# Patient Record
Sex: Male | Born: 1985 | Race: White | Hispanic: No | Marital: Married | Smoking: Former smoker
Health system: Southern US, Community
[De-identification: ages and names within clinical notes are randomized; demographics above are authoritative.]

## PROBLEM LIST (undated history)

## (undated) DIAGNOSIS — R1011 Right upper quadrant pain: Secondary | ICD-10-CM

## (undated) DIAGNOSIS — F1910 Other psychoactive substance abuse, uncomplicated: Secondary | ICD-10-CM

## (undated) DIAGNOSIS — K219 Gastro-esophageal reflux disease without esophagitis: Secondary | ICD-10-CM

## (undated) DIAGNOSIS — R519 Headache, unspecified: Secondary | ICD-10-CM

## (undated) DIAGNOSIS — E559 Vitamin D deficiency, unspecified: Secondary | ICD-10-CM

## (undated) HISTORY — DX: Right upper quadrant pain: R10.11

## (undated) HISTORY — DX: Headache, unspecified: R51.9

## (undated) HISTORY — PX: HERNIA REPAIR: SHX51

## (undated) HISTORY — DX: Vitamin D deficiency, unspecified: E55.9

## (undated) HISTORY — PX: WISDOM TOOTH EXTRACTION: SHX21

## (undated) HISTORY — DX: Gastro-esophageal reflux disease without esophagitis: K21.9

---
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
## 2003-02-04 ENCOUNTER — Emergency Department (HOSPITAL_COMMUNITY): Admission: AD | Admit: 2003-02-04 | Discharge: 2003-02-04 | Payer: Self-pay | Admitting: Family Medicine

## 2003-11-17 ENCOUNTER — Emergency Department (HOSPITAL_COMMUNITY): Admission: EM | Admit: 2003-11-17 | Discharge: 2003-11-17 | Payer: Self-pay | Admitting: Family Medicine

## 2005-04-13 ENCOUNTER — Emergency Department (HOSPITAL_COMMUNITY): Admission: EM | Admit: 2005-04-13 | Discharge: 2005-04-13 | Payer: Self-pay | Admitting: Emergency Medicine

## 2005-08-26 ENCOUNTER — Emergency Department (HOSPITAL_COMMUNITY): Admission: EM | Admit: 2005-08-26 | Discharge: 2005-08-26 | Payer: Self-pay | Admitting: Family Medicine

## 2007-08-05 ENCOUNTER — Emergency Department (HOSPITAL_COMMUNITY): Admission: EM | Admit: 2007-08-05 | Discharge: 2007-08-05 | Payer: Self-pay | Admitting: Family Medicine

## 2007-08-28 ENCOUNTER — Emergency Department (HOSPITAL_COMMUNITY): Admission: EM | Admit: 2007-08-28 | Discharge: 2007-08-28 | Payer: Self-pay | Admitting: Family Medicine

## 2009-02-25 ENCOUNTER — Emergency Department (HOSPITAL_COMMUNITY): Admission: EM | Admit: 2009-02-25 | Discharge: 2009-02-25 | Payer: Self-pay | Admitting: Family Medicine

## 2009-03-15 ENCOUNTER — Emergency Department (HOSPITAL_COMMUNITY): Admission: EM | Admit: 2009-03-15 | Discharge: 2009-03-15 | Payer: Self-pay | Admitting: Family Medicine

## 2009-11-11 ENCOUNTER — Emergency Department (HOSPITAL_COMMUNITY): Admission: EM | Admit: 2009-11-11 | Discharge: 2009-11-11 | Payer: Self-pay | Admitting: Family Medicine

## 2010-05-19 ENCOUNTER — Ambulatory Visit (INDEPENDENT_AMBULATORY_CARE_PROVIDER_SITE_OTHER): Payer: Self-pay

## 2010-05-19 ENCOUNTER — Inpatient Hospital Stay (INDEPENDENT_AMBULATORY_CARE_PROVIDER_SITE_OTHER)
Admission: RE | Admit: 2010-05-19 | Discharge: 2010-05-19 | Disposition: A | Discharge status: Home / Self Care | Payer: Self-pay | Source: Ambulatory Visit | Attending: Emergency Medicine | Admitting: Emergency Medicine

## 2010-05-19 DIAGNOSIS — K3189 Other diseases of stomach and duodenum: Secondary | ICD-10-CM

## 2010-05-19 DIAGNOSIS — K5900 Constipation, unspecified: Secondary | ICD-10-CM

## 2010-05-19 DIAGNOSIS — R1013 Epigastric pain: Secondary | ICD-10-CM

## 2010-05-19 LAB — POCT URINALYSIS DIPSTICK
Bilirubin Urine: NEGATIVE
Hgb urine dipstick: NEGATIVE
Ketones, ur: NEGATIVE mg/dL
Nitrite: NEGATIVE
Protein, ur: NEGATIVE mg/dL
Specific Gravity, Urine: 1.025 (ref 1.005–1.030)
Urine Glucose, Fasting: NEGATIVE mg/dL
Urobilinogen, UA: 0.2 mg/dL (ref 0.0–1.0)
pH: 7 (ref 5.0–8.0)

## 2010-07-07 LAB — POCT I-STAT, CHEM 8
Chloride: 101 mEq/L (ref 96–112)
Glucose, Bld: 108 mg/dL — ABNORMAL HIGH (ref 70–99)
HCT: 54 % — ABNORMAL HIGH (ref 39.0–52.0)
Potassium: 3.7 mEq/L (ref 3.5–5.1)

## 2010-07-07 LAB — POCT URINALYSIS DIP (DEVICE)
Glucose, UA: 100 mg/dL — AB
Hgb urine dipstick: NEGATIVE
Nitrite: NEGATIVE
Protein, ur: 30 mg/dL — AB
Specific Gravity, Urine: 1.025 (ref 1.005–1.030)
Urobilinogen, UA: 2 mg/dL — ABNORMAL HIGH (ref 0.0–1.0)
pH: 5.5 (ref 5.0–8.0)

## 2010-07-07 LAB — LIPASE, BLOOD: Lipase: 16 U/L (ref 11–59)

## 2010-08-11 ENCOUNTER — Inpatient Hospital Stay (INDEPENDENT_AMBULATORY_CARE_PROVIDER_SITE_OTHER)
Admission: RE | Admit: 2010-08-11 | Discharge: 2010-08-11 | Disposition: A | Discharge status: Home / Self Care | Payer: Self-pay | Source: Ambulatory Visit | Attending: Family Medicine | Admitting: Family Medicine

## 2010-08-11 ENCOUNTER — Ambulatory Visit (INDEPENDENT_AMBULATORY_CARE_PROVIDER_SITE_OTHER): Payer: Self-pay

## 2010-08-11 DIAGNOSIS — M545 Low back pain, unspecified: Secondary | ICD-10-CM

## 2010-08-20 NOTE — Consult Note (Signed)
[place]  Patient:    Hector Thomas, Hector Thomas                        MRN: [id number] Proc. Date: 07/04/99 Attending:  Angelia Mould. Derrell Lolling, M.D. CC:         Teena Irani. Donnie Coffin, M.D.                          Consultation Report  REASON FOR CONSULTATION:  Evaluate abdominal pain.  HISTORY OF PRESENT ILLNESS:  This is a 25 year old white male adolescent, previously in excellent health.  He has had some low-volume diarrhea for the past 48 hours.  This preceded the onset of abdominal pain.  At 9 p.m. last night, he  noted the gradual onset of lower abdominal pain which got worse.  He was nauseated earlier.  He now states the pain is better and the nausea has resolved, and he s now "starving."   He did have some chills earlier on.  He has not had any diarrhea for the past eight hours.  He denies pulmonary symptoms, specifically no cough r sputum production or chest pain.  He denies voiding difficulties, specifically denies urinary urgency or frequency.  He denies back pain.  He denies eating anything unusual.  He was evaluated in the emergency department, and blood work revealed hemoglobin 15.1, white count 14,400 with a left shift.  Creatinine 0.6.  Urinalysis was normal.  CT scan was nondiagnostic.  There was a little bit of free fluid in the pelvis.  The appendix was visualized and was slightly distended, being 9 mm in diameter, but there was certainly no sign of any soft tissue stranding or inflammatory change. The small intestine and large intestine were not inflamed or thick walled. This was reviewed with radiology, and they felt that these were nonspecific findings.  I was asked to see the patient by Dr. Ignacia Palma to rule out appendicitis.  PAST MEDICAL HISTORY:  Bilateral inguinal hernias at age 78 by Dr. Levie Heritage. Otherwise, no medical or surgical problems.  CURRENT MEDICATIONS:  None.  DRUG ALLERGIES:  None known.  FAMILY HISTORY:  Mother  living and well, had seizures in the past but none recently.  Father living and well.  He has one brother who was born with Hirschsprungs disease.  He has one sister living and well.  SOCIAL HISTORY:  The patient and his mother live in [place].  The mother is divorced.  The patient attends [employer] and is in the ninth grade.  He does smoke cigarettes but denies the use of alcohol or other drugs.  REVIEW OF SYSTEMS:  All systems are reviewed and are negative except as described above.  PHYSICAL EXAMINATION:  GENERAL:  A pleasant, healthy, adolescent male in no distress.  VITAL SIGNS:  Temperature 98.5, heart rate 88, respiratory rate 16, blood pressure 121/47.  HEENT:  Sclerae clear, nonicteric.  Extraocular movements intact.  Oropharynx clear without lesions.  NECK:  Supple, nontender.  No mass.   Carotid pulses 2+.  No adenopathy, on thyromegaly.  LUNGS:  Clear to auscultation.  No CVA tenderness.  HEART:  Regular rate and rhythm with no murmurs.  ABDOMEN:  Scaphoid and quite soft.  Active bowel sounds.  There was no palpable  mass.  There was no hernia.  There is mild diffuse lower abdominal tenderness which is mostly subjective in character.  He has no  guarding or rebound tenderness whatsoever.  GENITALIA:  Penis, scrotum, and testes were normal.  No inflammation or tenderness.  EXTREMITIES:  No edema.  Good pulses.  NEUROLOGIC:  Exam grossly within normal limits.  IMPRESSION:  Lower abdominal pain.  Physical findings and clinical course strongly suggest self-limited disease such as viral gastroenteritis.  I cannot completely exclude appendicitis, but this seems less likely.  Doubt Crohns disease.  PLAN:   I advised the patient and his mother that a period of observation would be the  most appropriate course.  Specifically, the patient is to stay on clear liquids for 12 to 24 hours, and the patient and his mother are to call me  or come to the emergency room if there is any fever greater than 101, worsening of pain, or vomiting.  I offered them observation in the hospital or at home, and they wanted to go home.  They were given my card, and the instructions were written out for  them.  They will call me if this does not completely resolve in the next 24 hours. DD:  07/04/99 TD:  07/04/99 Job: [id number]

## 2010-11-08 ENCOUNTER — Inpatient Hospital Stay (INDEPENDENT_AMBULATORY_CARE_PROVIDER_SITE_OTHER)
Admission: RE | Admit: 2010-11-08 | Discharge: 2010-11-08 | Disposition: A | Discharge status: Home / Self Care | Payer: Managed Care, Other (non HMO) | Source: Ambulatory Visit | Attending: Emergency Medicine | Admitting: Emergency Medicine

## 2010-11-08 ENCOUNTER — Ambulatory Visit (INDEPENDENT_AMBULATORY_CARE_PROVIDER_SITE_OTHER): Payer: Managed Care, Other (non HMO)

## 2010-11-08 DIAGNOSIS — R319 Hematuria, unspecified: Secondary | ICD-10-CM

## 2010-11-08 DIAGNOSIS — K5900 Constipation, unspecified: Secondary | ICD-10-CM

## 2010-11-08 LAB — OCCULT BLOOD, POC DEVICE: Fecal Occult Bld: NEGATIVE

## 2010-11-08 LAB — POCT URINALYSIS DIP (DEVICE)
Bilirubin Urine: NEGATIVE
Glucose, UA: NEGATIVE mg/dL
Ketones, ur: NEGATIVE mg/dL
Specific Gravity, Urine: 1.02 (ref 1.005–1.030)

## 2010-12-29 LAB — POCT RAPID STREP A: Streptococcus, Group A Screen (Direct): NEGATIVE

## 2011-02-01 ENCOUNTER — Ambulatory Visit (INDEPENDENT_AMBULATORY_CARE_PROVIDER_SITE_OTHER): Payer: Managed Care, Other (non HMO)

## 2011-02-01 ENCOUNTER — Inpatient Hospital Stay (INDEPENDENT_AMBULATORY_CARE_PROVIDER_SITE_OTHER)
Admission: RE | Admit: 2011-02-01 | Discharge: 2011-02-01 | Disposition: A | Discharge status: Home / Self Care | Payer: Managed Care, Other (non HMO) | Source: Ambulatory Visit | Attending: Family Medicine | Admitting: Family Medicine

## 2011-02-01 DIAGNOSIS — S61209A Unspecified open wound of unspecified finger without damage to nail, initial encounter: Secondary | ICD-10-CM

## 2011-02-01 DIAGNOSIS — R509 Fever, unspecified: Secondary | ICD-10-CM

## 2011-06-13 ENCOUNTER — Emergency Department (INDEPENDENT_AMBULATORY_CARE_PROVIDER_SITE_OTHER)
Admission: EM | Admit: 2011-06-13 | Discharge: 2011-06-13 | Disposition: A | Discharge status: Home / Self Care | Payer: Managed Care, Other (non HMO) | Source: Home / Self Care

## 2011-06-13 ENCOUNTER — Encounter (HOSPITAL_COMMUNITY): Payer: Self-pay | Admitting: *Deleted

## 2011-06-13 DIAGNOSIS — R112 Nausea with vomiting, unspecified: Secondary | ICD-10-CM

## 2011-06-13 MED ORDER — ONDANSETRON 4 MG PO TBDP
ORAL_TABLET | ORAL | Status: AC
  Filled 2011-06-13: qty 1

## 2011-06-13 MED ORDER — ONDANSETRON 4 MG PO TBDP
4.0000 mg | ORAL_TABLET | Freq: Once | ORAL | Status: AC
  Administered 2011-06-13: 4 mg via ORAL

## 2011-06-13 NOTE — ED Provider Notes (Signed)
Medical screening examination/treatment/procedure(s) were performed by non-physician practitioner and as supervising physician I was immediately available for consultation/collaboration.  Alen Bleacher, MD 06/13/11 [phone]

## 2011-06-13 NOTE — ED Provider Notes (Signed)
History     CSN: [id number]  Arrival date & time 06/13/11  1100   None     Chief Complaint  Patient presents with  . Nausea    (Consider location/radiation/quality/duration/timing/severity/associated sxs/prior treatment) HPI Comments: Patient presents today with complaints of nausea, vomiting, and abdominal cramping. He states symptoms began about 4 hours after eating some chicken last night. He states that he vomited 5 or 6 times him during the night. Last emesis was at 9 AM this morning. He continues to have nausea. No diarrhea. He is urinating normally without dysuria. No fever or chills. He denies any known sick contacts. He states other family members who ate the same dinner last night did not get ill.   History reviewed. No pertinent past medical history.  History reviewed. No pertinent past surgical history.  History reviewed. No pertinent family history.  History  Substance Use Topics  . Smoking status: Not on file  . Smokeless tobacco: Not on file  . Alcohol Use: Not on file      Review of Systems  Constitutional: Negative for fever and chills.  Gastrointestinal: Positive for nausea and vomiting. Negative for diarrhea and constipation.  Genitourinary: Negative for decreased urine volume.    Allergies  Review of patient's allergies indicates no known allergies.  Home Medications  No current outpatient prescriptions on file.  BP 138/84  Pulse 76  Temp(Src) 97.9 F (36.6 C) (Oral)  Resp 14  SpO2 100%  Physical Exam  Nursing note and vitals reviewed. Constitutional: He appears well-developed and well-nourished. No distress.  HENT:  Head: Normocephalic and atraumatic.  Right Ear: Tympanic membrane, external ear and ear canal normal.  Left Ear: Tympanic membrane, external ear and ear canal normal.  Nose: Nose normal.  Mouth/Throat: Uvula is midline, oropharynx is clear and moist and mucous membranes are normal. No oropharyngeal exudate, posterior  oropharyngeal edema or posterior oropharyngeal erythema.  Neck: Neck supple.  Cardiovascular: Normal rate, regular rhythm and normal heart sounds.   Pulmonary/Chest: Effort normal and breath sounds normal. No respiratory distress.  Abdominal: Soft. Bowel sounds are normal. He exhibits no distension and no mass. There is no tenderness.  Lymphadenopathy:    He has no cervical adenopathy.  Neurological: He is alert.  Skin: Skin is warm and dry.  Psychiatric: He has a normal mood and affect.    ED Course  Procedures (including critical care time)  Labs Reviewed - No data to display No results found.   1. Nausea & vomiting       MDM  Onset of N/V and stomach cramping after eating chicken dinner last night. Vomited 5-6 times over 10 hrs then resolved. Symptoms improving but still has some nausea and requesting off work note for today.         Melody Comas, [place] 06/13/11 1400

## 2011-06-13 NOTE — Discharge Instructions (Signed)
Clear fluids and a light bland diet today. Return if symptoms change or worsen.  Nausea and Vomiting Nausea means you feel sick to your stomach. Throwing up (vomiting) is a reflex where stomach contents come out of your mouth. HOME CARE   Take medicine as told by your doctor.   Do not force yourself to eat. However, you do need to drink fluids.   If you feel like eating, eat a normal diet as told by your doctor.   Eat rice, wheat, potatoes, bread, lean meats, yogurt, fruits, and vegetables.   Avoid high-fat foods.   Drink enough fluids to keep your pee (urine) clear or pale yellow.   Ask your doctor how to replace body fluid losses (rehydrate). Signs of body fluid loss (dehydration) include:   Feeling very thirsty.   Dry lips and mouth.   Feeling dizzy.   Dark pee.   Peeing less than normal.   Feeling confused.   Fast breathing or heart rate.  GET HELP RIGHT AWAY IF:   You have blood in your throw up.   You have black or bloody poop (stool).   You have a bad headache or stiff neck.   You feel confused.   You have bad belly (abdominal) pain.   You have chest pain or trouble breathing.   You do not pee at least once every 8 hours.   You have cold, clammy skin.   You keep throwing up after 24 to 48 hours.   You have a fever.  MAKE SURE YOU:   Understand these instructions.   Will watch your condition.   Will get help right away if you are not doing well or get worse.  Document Released: 09/07/2007 Document Revised: 03/10/2011 Document Reviewed: 08/20/2010 [facility] Patient Information 2012 [place], [place].

## 2011-06-13 NOTE — ED Notes (Signed)
Pt  Reports   Symptoms  Of  Nausea  Vomiting  Low  abd  Cramps        Witch    Started  About  1100 pm last  Night     No  Active  Vomiting  At  This  Time     he  Reported  Some  Low  abd  Cramps       To boot

## 2015-05-11 ENCOUNTER — Emergency Department (HOSPITAL_COMMUNITY)
Admission: EM | Admit: 2015-05-11 | Discharge: 2015-05-12 | Disposition: A | Discharge status: Home / Self Care | Payer: BLUE CROSS/BLUE SHIELD | Attending: Emergency Medicine | Admitting: Emergency Medicine

## 2015-05-11 ENCOUNTER — Emergency Department (HOSPITAL_COMMUNITY): Admission: EM | Admit: 2015-05-11 | Discharge: 2015-05-11 | Discharge status: Absent without leave | Payer: Self-pay

## 2015-05-11 ENCOUNTER — Emergency Department (HOSPITAL_COMMUNITY): Payer: BLUE CROSS/BLUE SHIELD

## 2015-05-11 ENCOUNTER — Encounter (HOSPITAL_COMMUNITY): Payer: Self-pay

## 2015-05-11 DIAGNOSIS — R0602 Shortness of breath: Secondary | ICD-10-CM | POA: Insufficient documentation

## 2015-05-11 DIAGNOSIS — R202 Paresthesia of skin: Secondary | ICD-10-CM | POA: Insufficient documentation

## 2015-05-11 DIAGNOSIS — Z79899 Other long term (current) drug therapy: Secondary | ICD-10-CM | POA: Insufficient documentation

## 2015-05-11 DIAGNOSIS — F17200 Nicotine dependence, unspecified, uncomplicated: Secondary | ICD-10-CM | POA: Diagnosis not present

## 2015-05-11 DIAGNOSIS — Z792 Long term (current) use of antibiotics: Secondary | ICD-10-CM | POA: Diagnosis not present

## 2015-05-11 DIAGNOSIS — R079 Chest pain, unspecified: Secondary | ICD-10-CM | POA: Diagnosis not present

## 2015-05-11 DIAGNOSIS — R61 Generalized hyperhidrosis: Secondary | ICD-10-CM | POA: Diagnosis not present

## 2015-05-11 DIAGNOSIS — R05 Cough: Secondary | ICD-10-CM | POA: Insufficient documentation

## 2015-05-11 LAB — I-STAT CHEM 8, ED
BUN: 22 mg/dL — AB (ref 6–20)
CALCIUM ION: 1.16 mmol/L (ref 1.12–1.23)
CREATININE: 1 mg/dL (ref 0.61–1.24)
Chloride: 103 mmol/L (ref 101–111)
Glucose, Bld: 103 mg/dL — ABNORMAL HIGH (ref 65–99)
HCT: 52 % (ref 39.0–52.0)
Hemoglobin: 17.7 g/dL — ABNORMAL HIGH (ref 13.0–17.0)
Potassium: 3.6 mmol/L (ref 3.5–5.1)
SODIUM: 138 mmol/L (ref 135–145)
TCO2: 24 mmol/L (ref 0–100)

## 2015-05-11 LAB — CBC
HCT: 46.5 % (ref 39.0–52.0)
HEMOGLOBIN: 16.3 g/dL (ref 13.0–17.0)
MCH: 30.8 pg (ref 26.0–34.0)
MCHC: 35.1 g/dL (ref 30.0–36.0)
MCV: 87.7 fL (ref 78.0–100.0)
Platelets: 212 10*3/uL (ref 150–400)
RBC: 5.3 MIL/uL (ref 4.22–5.81)
RDW: 12.1 % (ref 11.5–15.5)
WBC: 7.4 10*3/uL (ref 4.0–10.5)

## 2015-05-11 LAB — BRAIN NATRIURETIC PEPTIDE: B Natriuretic Peptide: 3.5 pg/mL (ref 0.0–100.0)

## 2015-05-11 LAB — I-STAT TROPONIN, ED: Troponin i, poc: 0 ng/mL (ref 0.00–0.08)

## 2015-05-11 NOTE — ED Notes (Signed)
Pt complains of left chest pain and arm tingling tonight during a basketball game, he describes it as pressure and hurting when coughing

## 2015-05-12 LAB — URINE MICROSCOPIC-ADD ON
Bacteria, UA: NONE SEEN
Squamous Epithelial / LPF: NONE SEEN
WBC UA: NONE SEEN WBC/hpf (ref 0–5)

## 2015-05-12 LAB — URINALYSIS, ROUTINE W REFLEX MICROSCOPIC
Bilirubin Urine: NEGATIVE
Glucose, UA: NEGATIVE mg/dL
KETONES UR: NEGATIVE mg/dL
LEUKOCYTES UA: NEGATIVE
Nitrite: NEGATIVE
PROTEIN: NEGATIVE mg/dL
Specific Gravity, Urine: 1.037 — ABNORMAL HIGH (ref 1.005–1.030)
pH: 6 (ref 5.0–8.0)

## 2015-05-12 LAB — I-STAT TROPONIN, ED: TROPONIN I, POC: 0 ng/mL (ref 0.00–0.08)

## 2015-05-12 NOTE — Discharge Instructions (Signed)
Nonspecific Chest Pain  °Chest pain can be caused by many different conditions. There is always a chance that your pain could be related to something serious, such as a heart attack or a blood clot in your lungs. Chest pain can also be caused by conditions that are not life-threatening. If you have chest pain, it is very important to follow up with your health care provider. °CAUSES  °Chest pain can be caused by: °· Heartburn. °· Pneumonia or bronchitis. °· Anxiety or stress. °· Inflammation around your heart (pericarditis) or lung (pleuritis or pleurisy). °· A blood clot in your lung. °· A collapsed lung (pneumothorax). It can develop suddenly on its own (spontaneous pneumothorax) or from trauma to the chest. °· Shingles infection (varicella-zoster virus). °· Heart attack. °· Damage to the bones, muscles, and cartilage that make up your chest wall. This can include: °¨ Bruised bones due to injury. °¨ Strained muscles or cartilage due to frequent or repeated coughing or overwork. °¨ Fracture to one or more ribs. °¨ Sore cartilage due to inflammation (costochondritis). °RISK FACTORS  °Risk factors for chest pain may include: °· Activities that increase your risk for trauma or injury to your chest. °· Respiratory infections or conditions that cause frequent coughing. °· Medical conditions or overeating that can cause heartburn. °· Heart disease or family history of heart disease. °· Conditions or health behaviors that increase your risk of developing a blood clot. °· Having had chicken pox (varicella zoster). °SIGNS AND SYMPTOMS °Chest pain can feel like: °· Burning or tingling on the surface of your chest or deep in your chest. °· Crushing, pressure, aching, or squeezing pain. °· Dull or sharp pain that is worse when you move, cough, or take a deep breath. °· Pain that is also felt in your back, neck, shoulder, or arm, or pain that spreads to any of these areas. °Your chest pain may come and go, or it may stay  constant. °DIAGNOSIS °Lab tests or other studies may be needed to find the cause of your pain. Your health care provider may have you take a test called an ambulatory ECG (electrocardiogram). An ECG records your heartbeat patterns at the time the test is performed. You may also have other tests, such as: °· Transthoracic echocardiogram (TTE). During echocardiography, sound waves are used to create a picture of all of the heart structures and to look at how blood flows through your heart. °· Transesophageal echocardiogram (TEE). This is a more advanced imaging test that obtains images from inside your body. It allows your health care provider to see your heart in finer detail. °· Cardiac monitoring. This allows your health care provider to monitor your heart rate and rhythm in real time. °· Holter monitor. This is a portable device that records your heartbeat and can help to diagnose abnormal heartbeats. It allows your health care provider to track your heart activity for several days, if needed. °· Stress tests. These can be done through exercise or by taking medicine that makes your heart beat more quickly. °· Blood tests. °· Imaging tests. °TREATMENT  °Your treatment depends on what is causing your chest pain. Treatment may include: °· Medicines. These may include: °¨ Acid blockers for heartburn. °¨ Anti-inflammatory medicine. °¨ Pain medicine for inflammatory conditions. °¨ Antibiotic medicine, if an infection is present. °¨ Medicines to dissolve blood clots. °¨ Medicines to treat coronary artery disease. °· Supportive care for conditions that do not require medicines. This may include: °¨ Resting. °¨ Applying heat   or cold packs to injured areas. °¨ Limiting activities until pain decreases. °HOME CARE INSTRUCTIONS °· If you were prescribed an antibiotic medicine, finish it all even if you start to feel better. °· Avoid any activities that bring on chest pain. °· Do not use any tobacco products, including  cigarettes, chewing tobacco, or electronic cigarettes. If you need help quitting, ask your health care provider. °· Do not drink alcohol. °· Take medicines only as directed by your health care provider. °· Keep all follow-up visits as directed by your health care provider. This is important. This includes any further testing if your chest pain does not go away. °· If heartburn is the cause for your chest pain, you may be told to keep your head raised (elevated) while sleeping. This reduces the chance that acid will go from your stomach into your esophagus. °· Make lifestyle changes as directed by your health care provider. These may include: °¨ Getting regular exercise. Ask your health care provider to suggest some activities that are safe for you. °¨ Eating a heart-healthy diet. A registered dietitian can help you to learn healthy eating options. °¨ Maintaining a healthy weight. °¨ Managing diabetes, if necessary. °¨ Reducing stress. °SEEK MEDICAL CARE IF: °· Your chest pain does not go away after treatment. °· You have a rash with blisters on your chest. °· You have a fever. °SEEK IMMEDIATE MEDICAL CARE IF:  °· Your chest pain is worse. °· You have an increasing cough, or you cough up blood. °· You have severe abdominal pain. °· You have severe weakness. °· You faint. °· You have chills. °· You have sudden, unexplained chest discomfort. °· You have sudden, unexplained discomfort in your arms, back, neck, or jaw. °· You have shortness of breath at any time. °· You suddenly start to sweat, or your skin gets clammy. °· You feel nauseous or you vomit. °· You suddenly feel light-headed or dizzy. °· Your heart begins to beat quickly, or it feels like it is skipping beats. °These symptoms may represent a serious problem that is an emergency. Do not wait to see if the symptoms will go away. Get medical help right away. Call your local emergency services (911 in the [place] Do not drive yourself to the hospital. °  °This  information is not intended to replace advice given to you by your health care provider. Make sure you discuss any questions you have with your health care provider. °  °Document Released: 12/29/2004 Document Revised: 04/11/2014 Document Reviewed: 10/25/2013 °Elsevier Interactive Patient Education ©2016 Elsevier Inc. ° ° °Emergency Department Resource Guide °1) Find a Doctor and Pay Out of Pocket °Although you won't have to find out who is covered by your insurance plan, it is a good idea to ask around and get recommendations. You will then need to call the office and see if the doctor you have chosen will accept you as a new patient and what types of options they offer for patients who are self-pay. Some doctors offer discounts or will set up payment plans for their patients who do not have insurance, but you will need to ask so you aren't surprised when you get to your appointment. ° °2) Contact Your Local Health Department °Not all health departments have doctors that can see patients for sick visits, but many do, so it is worth a call to see if yours does. If you don't know where your local health department is, you can check in your phone book.   The CDC also has a tool to help you locate your state's health department, and many state websites also have listings of all of their local health departments. ° °3) Find a Walk-in Clinic °If your illness is not likely to be very severe or complicated, you may want to try a walk in clinic. These are popping up all over the country in pharmacies, drugstores, and shopping centers. They're usually staffed by nurse practitioners or physician assistants that have been trained to treat common illnesses and complaints. They're usually fairly quick and inexpensive. However, if you have serious medical issues or chronic medical problems, these are probably not your best option. ° °No Primary Care Doctor: °- Call [facility] at  832-8000 - they can help you locate a primary  care doctor that  accepts your insurance, provides certain services, etc. °- Physician Referral Service- 1-800-533-3463 ° °Chronic Pain Problems: °Organization         Address  Phone   Notes  °[facility]  (336) 297-2271 Patients need to be referred by their primary care doctor.  ° °Medication Assistance: °Organization         Address  Phone   Notes  °[place] [facility] [address] °[place], [place] [address] (336) 641-8030 --Must be a resident of [place] °-- Must have NO insurance coverage whatsoever (no Medicaid/ Medicare, etc.) °-- The pt. MUST have a primary care doctor that directs their care regularly and follows them in the community °  °[facility]  (866) 331-1348   °[employer]  (888) 892-1162   ° °Agencies that provide inexpensive medical care: °Organization         Address  Phone   Notes  °[facility]  (336) 832-8035   °[facility]    (336) 832-7272   °[facility] [address] °[place], [place] [address] (336) 832-4777   °[facility] [address], °[place] (336) 271-4999   °[facility]    (336) 373-0678   °[facility]    (336) 272-1050   °[facility] ° [address], [place] Phone:  (336) 832-4444, Fax:  (336) 832-4440 Hours of Operation:  9 am - 6 pm, M-F.  Also accepts Medicaid/Medicare and self-pay.  °[facility] ° [address], [place] Phone: (336) 832-3150, Fax: (336) [phone]. Hours of Operation:  8:30 am - 5:30 pm, M-F.  Also accepts Medicaid and self-pay.  °[facility] [place] [address], [place] Phone: (336) 878-6027   °[facility] [address], [place], [place] (336)723-1848, Ext. 123 Mondays & Thursdays: 7-9 AM.  First 15 patients are seen on a first come, first serve basis. °  ° °Medicaid-accepting [place]  Providers: ° °Organization         Address  Phone   Notes  °[facility] [address], [place] (336) 641-2100 Also accepts self-pay patients.  °[facility] [address], [place] ° (336) 856-9996   °[facility] [address], [place] (336) 288-8857   °[facility] [address] [place] [address], [place] (336) 299-7000   °Veita Bland [address], [place]  ° (336) 373-1557 Only accepts [place] Access Medicaid patients after they have their name applied to their card.  ° °Self-Pay (  no insurance) in [place]: ° °Organization         Address  Phone   Notes  °Sickle Cell Patients, [facility] [address], [place] (336) 832-1970   °[facility] [address], [place] (336) 832-4400   °[facility] [place] ° [address], [place] (336) 992-4800   °[facility]/Dr. Osei-Bonsu ° [address] [place] [address], [place] or [address], [place] (336) [phone] Phone number for both [place] and [place] locations is the same.  °[facility] [address], [place] (336) 299-0000   °[facility] [address] [place] [address], [place] or [address] (336) 852-7530 °(336) 878-2260   °[facility] [address], [place] (336) 350-1642, phone; (336) 294-5005, fax Sees patients 1st and 3rd Saturday of every month.  Must not qualify for public or private insurance (i.e. Medicaid, Medicare, [place] Health Choice, Veterans' Benefits) • Household income should be no more than 200% of the poverty level •The clinic cannot treat you if you are pregnant or think you are pregnant • Sexually transmitted diseases are not treated at the clinic.  ° ° °Dental Care: °Organization         Address  Phone  Notes  °[facility] [address], [place] (336) 641-6152 Accepts children up to age 21 who are enrolled in Medicaid or [place] Health Choice; pregnant women with a Medicaid card; and children who have applied for Medicaid or [place] Health Choice, but were declined, whose parents can pay a reduced fee at time of service.  °[facility] [place]  [address], [place] (336) 641-7733 Accepts children up to age 21 who are enrolled in Medicaid or [place] Health Choice; pregnant women with a Medicaid card; and children who have applied for Medicaid or [place] Health Choice, but were declined, whose parents can pay a reduced fee at time of service.  °[facility] ° [address], [place] (336) 641-4533 Patients are seen by appointment only. Walk-ins are not accepted. [facility] will see patients 18 years of age and older. °Monday - Tuesday (8am-5pm) °Most Wednesdays (8:30-5pm) °$30 per visit, cash only  °[facility] ° [address], [place] (336) 641-4533 Patients are seen by appointment only. Walk-ins are not accepted. [facility] will see patients 18 years of age and older. °One Wednesday Evening (Monthly: Volunteer Based).  $30 per visit, cash only  °[facility]  (919) 537-3737 for adults; Children under age 4, call Graduate Pediatric Dentistry at (919) [phone]. Children aged 4-14, please call (919) [phone] to request a pediatric application. ° Dental services are provided in all areas of dental care including fillings, crowns and bridges, complete and partial dentures, implants, gum treatment, root canals, and extractions. Preventive care is also provided. Treatment is provided to both adults and children. °Patients are selected via a lottery and there is often a waiting list. °  °[facility] [address], ° ° (336) 763-8833 [email or web address] °  °[facility]  [address], [place], [place] (336)723-1848, Ext. 123 Second and Fourth Thursday of each month, opens at 6:30 AM; Clinic ends at 9 AM.  Patients are seen on a first-come first-served basis, and a limited number are   seen during each clinic.  ° °[facility] ° [address], [place], [place] (336) 723-7904   Eligibility Requirements °You must have lived in [place], [place], or [place] counties for at least the last three months. °  You cannot be eligible for state or federal sponsored healthcare insurance, including Veterans Administration, Medicaid, or Medicare. °  You generally cannot be eligible for healthcare insurance through your employer.  °  How to apply: °Eligibility screenings are held every Tuesday and Wednesday afternoon from 1:00 pm until 4:00 pm. You do not need an appointment for the interview!  °[facility] [address], [place], [place] 336-631-2330   °[facility]  336-342-8273   °[facility]  336-703-3100   °[facility]  336-570-6415   ° °Behavioral Health Resources in the Community: °Intensive Outpatient Programs °Organization         Address  Phone  Notes  °[facility] [address], [place], [place] 336-878-6098   °[facility] [address], [place], [place] 336-832-9800   °[facility] [address], [place], [place] ° 336-882-2125   °[facility] [address],  °[place], [place] 1-800-853-5163 or 336-641-4981   °Substance Abuse Resources °Organization         Address  Phone  Notes  °[facility]  336-882-2125   °[facility]  336-784-9470   °[facility]  336-285-9073   °[facility]  336-845-3988   °Residential & Outpatient Substance Abuse Program  1-800-659-3381   °Psychological Services °Organization         Address  Phone  Notes  °[facility]  336- 832-9600    °[facility]  336- 378-7881   °[facility] [address], [place] 1-800-853-5163 or 336-641-4981   ° °Mobile Crisis Teams °Organization         Address  Phone  Notes  °[facility], Mobile Crisis Care Unit  1-877-626-1772   °Assertive °[facility] ° [address] [place], [place] 336-834-9664   °Sharon DeEsch [address] °[place] 336-554-5454   ° °Self-Help/Support Groups °Organization         Address  Phone             Notes  °[facility] - variety of support groups  336- 373-1402 Call for more information  °Narcotics Anonymous (NA), [facility] [address], °[place]  2 meetings at this location  ° °Residential Treatment Programs °Organization         Address  Phone  Notes  °[facility] [address],    °[place]  1-866-801-8205   °[facility] ° [address], [place], [place] 704-293-8524   °[facility] [address], [place] 336-845-3988 Admissions: 8am-3pm M-F  °[facility] [address]    °[place], [place] 336-841-1104   °[facility] [address], [place], [place] 336-379-7146   °[facility] [address]  °[place], [place] 336-285-9073   °[facility] Intensive Outpatient [address], [place], [place] 336-852-3033   °[facility] [address]  °[place], [place] 1-877-615-2722 or 336-784-9470   °[facility] [address] [place], [place] 336-227-7417 Accepts Medicaid  °[facility] [address]  °  [place] 1-800-659-3381 Substance Abuse/Addiction Treatment  ° °[place] Behavioral Health Resources °Organization         Address  Phone  Notes  °[facility]  (888) 581-9988   °Julie Brannon, PhD [address] [place], [place]   (336) 349-5553 or (336) 951-0000    °[facility]   [address] °[place], [place] (336) 349-4454   °[facility] [address], [place], [place] (336) 342-8316 Insurance/Medicaid/sponsorship through [facility]  °[facility] [address]                                    [place], [place] (336) 342-8316 Therapy/tele-psych/case  °[facility] [address]  ° [place], [place] (336) 349-2233    °Dr. Arfeen  (336) 349-4544   °[facility] [place]  [employer] [place] [facility] 1) [address], [place] °2) [address], [place] °3)  [address], [place] (336) 349-3220 °(336) 342-7768 ° °(336) 342-8140   °[place] [facility] (336) 342-1394 or (336) 342-3537 (After Hours)    ° ° ° °

## 2015-05-12 NOTE — ED Provider Notes (Signed)
By signing my name below, I, [name], attest that this documentation has been prepared under the direction and in the presence of [name], DO . Electronically Signed: [name], Scribe. 05/12/2015. 1:43 AM.  TIME SEEN: 1:29 AM  CHIEF COMPLAINT: Chest Pain  HPI: HPI Comments:  Hector Thomas is a 30 y.o. male with history of tobacco use who presents to the Emergency Department complaining of gradual onset left-side chest pain described as pressure beginning yesterday morning, and worsening yesterday evening around 7 hrs ago. Pt reports associated left arm tingling, diaphoresis, dry cough, and SOB. He notes current relief of symptoms. He is now completely asymptomatic. He reports FHx of CAD in mother diagnosed ~34 yo. Pt is a current smoker. Pt denies having a stress test in the past. He does not have a PCP currently. Pt denies nausea, dizziness, blood clots in legs or lungs, recent travel, surgery, or broken bones. Symptoms started at rest.  ROS: See HPI Constitutional: no fever  Eyes: no drainage  ENT: no runny nose   Cardiovascular:  chest pain  Resp: SOB  GI: no vomiting GU: no dysuria Integumentary: no rash  Allergy: no hives  Musculoskeletal: no leg swelling  Neurological: no slurred speech ROS otherwise negative  PAST MEDICAL HISTORY/PAST SURGICAL HISTORY:  History reviewed. No pertinent past medical history.  MEDICATIONS:  Prior to Admission medications   Medication Sig Start Date End Date Taking? Authorizing Provider  PROAIR HFA 108 [phone] Base) MCG/ACT inhaler Inhale 1-2 puffs into the lungs every 4 (four) hours as needed. Shortness of breath/ wheezing 04/17/15  Yes Historical Provider, MD  levofloxacin (LEVAQUIN) 500 MG tablet Take 500 mg by mouth daily. Reported on 05/11/2015 04/17/15   Historical Provider, MD    ALLERGIES:  No Known Allergies  SOCIAL HISTORY:  Social History  Substance Use Topics  . Smoking status: Current Every Day Smoker  .  Smokeless tobacco: Not on file  . Alcohol Use: Yes    FAMILY HISTORY: History reviewed. No pertinent family history.  EXAM: BP 122/96 mmHg  Pulse 79  Temp(Src) 98.5 F (36.9 C) (Oral)  Resp 16  Ht  (1.676 m)  Wt 206 lb (93.441 kg)  BMI 33.27 kg/m2  SpO2 99% CONSTITUTIONAL: Alert and oriented and responds appropriately to questions. Well-appearing; well-nourished HEAD: Normocephalic EYES: Conjunctivae clear, PERRL ENT: normal nose; no rhinorrhea; moist mucous membranes; pharynx without lesions noted NECK: Supple, no meningismus, no LAD  CARD: RRR; S1 and S2 appreciated; no murmurs, no clicks, no rubs, no gallops RESP: Normal chest excursion without splinting or tachypnea; breath sounds clear and equal bilaterally; no wheezes, no rhonchi, no rales, no hypoxia or respiratory distress, speaking full sentences ABD/GI: Normal bowel sounds; non-distended; soft, non-tender, no rebound, no guarding, no peritoneal signs BACK:  The back appears normal and is non-tender to palpation, there is no CVA tenderness EXT: Normal ROM in all joints; non-tender to palpation; no edema; normal capillary refill; no cyanosis, no calf tenderness or swelling    SKIN: Normal color for age and race; warm NEURO: Moves all extremities equally, sensation to light touch intact diffusely, cranial nerves II through XII intact PSYCH: The patient's mood and manner are appropriate. Grooming and personal hygiene are appropriate.  MEDICAL DECISION MAKING: Patient here with episode of chest pain. His have a history of tobacco use and family history of coronary artery disease. He has had 2 negative troponins. EKG shows no ischemic abnormality. He is now completely asymptomatic. Chest x-ray  is clear. I have low suspicion that this is ACS and I feel he is safe to be discharged. He has no risk factors for pulmonary embolus and is PERC negative. Have recommended close outpatient follow-up with her primary care provider. Have  discussed strict return precautions. He verbalizes understanding and is comfortable with this plan.      EKG Interpretation  Date/Time:  Monday May 11 2015 21:14:38 EST Ventricular Rate:  70 PR Interval:  116 QRS Duration: 94 QT Interval:  353 QTC Calculation: 381 R Axis:   66 Text Interpretation:  Sinus rhythm Borderline short PR interval No significant change since last tracing in 2007 Reconfirmed by [name],  DO, [name] [phone]) on 05/12/2015 1:06:39 AM         I personally performed the services described in this documentation, which was scribed in my presence. The recorded information has been reviewed and is accurate.    [name], DO 05/12/15 0222

## 2015-11-29 ENCOUNTER — Encounter: Payer: Self-pay | Admitting: Emergency Medicine

## 2015-11-29 ENCOUNTER — Emergency Department (INDEPENDENT_AMBULATORY_CARE_PROVIDER_SITE_OTHER)
Admission: EM | Admit: 2015-11-29 | Discharge: 2015-11-29 | Disposition: A | Discharge status: Home / Self Care | Payer: BLUE CROSS/BLUE SHIELD | Source: Home / Self Care | Attending: Family Medicine | Admitting: Family Medicine

## 2015-11-29 DIAGNOSIS — M5442 Lumbago with sciatica, left side: Secondary | ICD-10-CM | POA: Diagnosis not present

## 2015-11-29 DIAGNOSIS — S39012A Strain of muscle, fascia and tendon of lower back, initial encounter: Secondary | ICD-10-CM | POA: Diagnosis not present

## 2015-11-29 MED ORDER — KETOROLAC TROMETHAMINE 30 MG/ML IJ SOLN
30.0000 mg | Freq: Once | INTRAMUSCULAR | Status: AC
  Administered 2015-11-29: 30 mg via INTRAMUSCULAR

## 2015-11-29 MED ORDER — METHYLPREDNISOLONE ACETATE 80 MG/ML IJ SUSP
80.0000 mg | Freq: Once | INTRAMUSCULAR | Status: AC
  Administered 2015-11-29: 80 mg via INTRAMUSCULAR

## 2015-11-29 MED ORDER — PREDNISONE 20 MG PO TABS: ORAL_TABLET | ORAL | 0 refills | Status: DC

## 2015-11-29 MED ORDER — MELOXICAM 7.5 MG PO TABS: 7.5000 mg | ORAL_TABLET | Freq: Every day | ORAL | 0 refills | Status: DC

## 2015-11-29 MED ORDER — METHOCARBAMOL 500 MG PO TABS: 500.0000 mg | ORAL_TABLET | Freq: Two times a day (BID) | ORAL | 0 refills | Status: DC

## 2015-11-29 MED ORDER — HYDROCODONE-ACETAMINOPHEN 5-325 MG PO TABS: 1.0000 | ORAL_TABLET | Freq: Four times a day (QID) | ORAL | 0 refills | Status: DC | PRN

## 2015-11-29 NOTE — ED Triage Notes (Signed)
[facility]/O pain in the lower back that has been intermittent for several months. Today after mowing patients pain was worse, radiating into the left buttocks and down the left leg into the left foot. Constant pain with intermittent spasms. Patient rates pain 8/10

## 2015-11-29 NOTE — ED Provider Notes (Signed)
CSN: [id number]     Arrival date & time 11/29/15  1453 History   First MD Initiated Contact with Patient 11/29/15 1550     Chief Complaint  Patient presents with  . Back Pain   (Consider location/radiation/quality/duration/timing/severity/associated sxs/prior Treatment) HPI Hector Thomas is a 30 y.o. male presenting to UC with c/o gradually worsening lower back pain over the last several months.  Earlier today after pt finished mowing a lawn on a ride-on mower back pain became even more severe causing pt to have difficulty moving.  Pain is aching and sharp at times with associated spasms.  Pain is 8/10 at this time, worse with certain movements. He reports taking ibuprofen w/o relief around 9AM. His father did give him one of his Vicodin just PTA w/o much relief. Pt notes he also tried one of his wife's muscle relaxers last week but did not have much relief.  Pain occasionally radiates down Left leg. He was considering f/u with an orthopedist but was unsure if he needed a referral. Denies change in bowel or bladder habits.   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 No hx of back surgeries.    History reviewed. No pertinent past medical history. History reviewed. No pertinent surgical history. History reviewed. No pertinent family history. Social History  Substance Use Topics  . Smoking status: Current Every Day Smoker  . Smokeless tobacco: Never Used  . Alcohol use Yes    Review of Systems  Constitutional: Negative for chills and fever.  Gastrointestinal: Negative for abdominal pain, nausea and vomiting.  Genitourinary: Negative for dysuria, flank pain, frequency and hematuria.  Musculoskeletal: Positive for back pain, gait problem and myalgias. Negative for arthralgias, joint swelling, neck pain and neck stiffness.  Skin: Negative for color change and rash.  Neurological: Negative for weakness and  numbness.    Allergies  Review of patient's allergies indicates no known allergies.  Home Medications   Prior to Admission medications   Medication Sig Start Date End Date Taking? Authorizing Provider  acidophilus (RISAQUAD) CAPS capsule Take 1 capsule by mouth daily.   Yes Historical Provider, MD  ibuprofen (ADVIL,MOTRIN) 800 MG tablet Take 800 mg by mouth every 8 (eight) hours as needed.   Yes Historical Provider, MD  HYDROcodone-acetaminophen (NORCO/VICODIN) 5-325 MG tablet Take 1-2 tablets by mouth every 6 (six) hours as needed for moderate pain or severe pain. 11/29/15   Junius FinnerErin O'Malley, PA-C  levofloxacin (LEVAQUIN) 500 MG tablet Take 500 mg by mouth daily. Reported on 05/11/2015 04/17/15   Historical Provider, MD  meloxicam (MOBIC) 7.5 MG tablet Take 1-2 tablets (7.5-15 mg total) by mouth daily. Take 2 tabs daily for 5 days, then 1-2 tabs daily as needed for pain 11/29/15   Junius FinnerErin O'Malley, PA-C  methocarbamol (ROBAXIN) 500 MG tablet Take 1 tablet (500 mg total) by mouth 2 (two) times daily. 11/29/15   Junius FinnerErin O'Malley, PA-C  predniSONE (DELTASONE) 20 MG tablet 3 tabs po day one, then 2 po daily x 4 days 11/29/15   Junius FinnerErin O'Malley, PA-C  PROAIR HFA 108 [id number](90 Base) MCG/ACT inhaler Inhale 1-2 puffs into the lungs every 4 (four) hours as needed. Shortness of breath/ wheezing 04/17/15   Historical Provider, MD   Meds Ordered and Administered this Visit   Medications  ketorolac (TORADOL) 30 MG/ML injection 30 mg (30 mg Intramuscular Given 11/29/15 1528)  methylPREDNISolone acetate (DEPO-MEDROL) injection  80 mg (80 mg Intramuscular Given 11/29/15 1600)    BP 145/96 (BP Location: Left Arm)   Pulse 78   Temp 97.9 F (36.6 C) (Oral)   Resp 16   Ht 5\' 6"  (1.676 m)   Wt 202 lb (91.6 kg)   SpO2 98%   BMI 32.60 kg/m  No data found.   Physical Exam  Constitutional: He is oriented to person, place, and time. He appears well-developed and well-nourished.  HENT:  Head: Normocephalic and atraumatic.   Eyes: EOM are normal.  Neck: Normal range of motion.  Cardiovascular: Normal rate.   Pulmonary/Chest: Effort normal.  Musculoskeletal: Normal range of motion. He exhibits tenderness. He exhibits no edema.  No midline spinal tenderness. Tenderness to Left lower lumbar muscles, Left buttock and Left lateral thigh.  Negative straight leg raise. Full ROM upper and lower extremities with 5/5 strength bilaterally.   Neurological: He is alert and oriented to person, place, and time.  Skin: Skin is warm and dry. No erythema.  Psychiatric: He has a normal mood and affect. His behavior is normal.  Nursing note and vitals reviewed.   Urgent Care Course   Clinical Course    Procedures (including critical care time)  Labs Review Labs Reviewed - No data to display  Imaging Review No results found.    MDM   1. Low back strain, initial encounter   2. Left-sided low back pain with left-sided sciatica    Pt c/o worsening lower back pain, worse on Left side radiating down Left leg. No red flag symptoms. No known injury. No indication for imaging at this time. Will treat conservatively for muscle strain and sciatica.  Toradol 30mg  IM and Depomedrol 80mg  IM given in UC Pain improved from 8/10 to 3/10  Home care instructions provided. Rx: Norco (only take as needed for severe pain, do not share with others), prednisone, meloxicam and robaxin See AVS for medication warnings.  Encouraged to call to schedule f/u appointment with Dr. Denyse Amass, Sports Medicine, for further evaluation and treatment of lower back pain. Patient verbalized understanding and agreement with treatment plan.     Junius Finner, PA-C 11/29/15 [phone]

## 2015-11-29 NOTE — Discharge Instructions (Signed)
°  You were given a shot of depomedrol (a steroid) today to help with inflammation and pain in your back.  You have been prescribed 5 days of prednisone, an oral steroid.  You may start this medication tomorrow with breakfast.    Norco/Vicodin (hydrocodone-acetaminophen) is a narcotic pain medication, do not combine these medications with others containing tylenol. While taking, do not drink alcohol, drive, or perform any other activities that requires focus while taking these medications. ONLY take as needed for severe pain. Do not share with others as this medication can cause dangerous side effects including difficulty breathing and death if combined with other medications you may not be aware someone else is taking.  Meloxicam (Mobic) is an antiinflammatory to help with pain and inflammation.  Do not take ibuprofen, Advil, Aleve, or any other medications that contain NSAIDs while taking meloxicam as this may cause stomach upset or even ulcers if taken in large amounts for an extended period of time.   Robaxin is a muscle relaxer and may cause drowsiness. Do not drink alcohol, drive, or operate heavy machinery while taking.

## 2015-12-01 ENCOUNTER — Encounter: Payer: Self-pay | Admitting: Gastroenterology

## 2016-02-04 ENCOUNTER — Emergency Department (HOSPITAL_COMMUNITY)
Admission: EM | Admit: 2016-02-04 | Discharge: 2016-02-04 | Disposition: A | Discharge status: Home / Self Care | Payer: BLUE CROSS/BLUE SHIELD | Attending: Emergency Medicine | Admitting: Emergency Medicine

## 2016-02-04 ENCOUNTER — Encounter (HOSPITAL_COMMUNITY): Payer: Self-pay | Admitting: Emergency Medicine

## 2016-02-04 DIAGNOSIS — Z79899 Other long term (current) drug therapy: Secondary | ICD-10-CM | POA: Diagnosis not present

## 2016-02-04 DIAGNOSIS — R42 Dizziness and giddiness: Secondary | ICD-10-CM | POA: Diagnosis not present

## 2016-02-04 DIAGNOSIS — R1010 Upper abdominal pain, unspecified: Secondary | ICD-10-CM

## 2016-02-04 DIAGNOSIS — F17200 Nicotine dependence, unspecified, uncomplicated: Secondary | ICD-10-CM | POA: Diagnosis not present

## 2016-02-04 DIAGNOSIS — R1011 Right upper quadrant pain: Secondary | ICD-10-CM | POA: Diagnosis present

## 2016-02-04 LAB — HEPATIC FUNCTION PANEL
ALBUMIN: 4.3 g/dL (ref 3.5–5.0)
ALT: 45 U/L (ref 17–63)
AST: 47 U/L — AB (ref 15–41)
Alkaline Phosphatase: 81 U/L (ref 38–126)
BILIRUBIN TOTAL: 1.2 mg/dL (ref 0.3–1.2)
Bilirubin, Direct: 0.4 mg/dL (ref 0.1–0.5)
Indirect Bilirubin: 0.8 mg/dL (ref 0.3–0.9)
TOTAL PROTEIN: 7.2 g/dL (ref 6.5–8.1)

## 2016-02-04 LAB — URINALYSIS, ROUTINE W REFLEX MICROSCOPIC
Bilirubin Urine: NEGATIVE
GLUCOSE, UA: NEGATIVE mg/dL
Hgb urine dipstick: NEGATIVE
KETONES UR: NEGATIVE mg/dL
LEUKOCYTES UA: NEGATIVE
NITRITE: NEGATIVE
PROTEIN: NEGATIVE mg/dL
Specific Gravity, Urine: 1.014 (ref 1.005–1.030)
pH: 6 (ref 5.0–8.0)

## 2016-02-04 LAB — CBC
HEMATOCRIT: 48.8 % (ref 39.0–52.0)
Hemoglobin: 16.9 g/dL (ref 13.0–17.0)
MCH: 30.8 pg (ref 26.0–34.0)
MCHC: 34.6 g/dL (ref 30.0–36.0)
MCV: 89.1 fL (ref 78.0–100.0)
PLATELETS: 204 10*3/uL (ref 150–400)
RBC: 5.48 MIL/uL (ref 4.22–5.81)
RDW: 12.4 % (ref 11.5–15.5)
WBC: 5.8 10*3/uL (ref 4.0–10.5)

## 2016-02-04 LAB — BASIC METABOLIC PANEL
Anion gap: 7 (ref 5–15)
BUN: 13 mg/dL (ref 6–20)
CHLORIDE: 107 mmol/L (ref 101–111)
CO2: 23 mmol/L (ref 22–32)
Calcium: 9.2 mg/dL (ref 8.9–10.3)
Creatinine, Ser: 0.87 mg/dL (ref 0.61–1.24)
Glucose, Bld: 101 mg/dL — ABNORMAL HIGH (ref 65–99)
POTASSIUM: 4.9 mmol/L (ref 3.5–5.1)
SODIUM: 137 mmol/L (ref 135–145)

## 2016-02-04 LAB — CBG MONITORING, ED: Glucose-Capillary: 102 mg/dL — ABNORMAL HIGH (ref 65–99)

## 2016-02-04 LAB — LIPASE, BLOOD: LIPASE: 22 U/L (ref 11–51)

## 2016-02-04 MED ORDER — OMEPRAZOLE 20 MG PO CPDR: 20.0000 mg | DELAYED_RELEASE_CAPSULE | Freq: Every day | ORAL | 0 refills | Status: DC

## 2016-02-04 NOTE — ED Provider Notes (Signed)
[facility]-EMERGENCY DEPT Provider Note   CSN: [id number] Arrival date & time: 02/04/16  0737     History   Chief Complaint Chief Complaint  Patient presents with  . multiple complaints  . Dizziness  . rib cage pain  . Bad taste in Mouth    HPI Hector Thomas is a [age] y.o. male.  HPI Patient presents with right-sided upper abdominal or lower rib pain. Began a   a few weeks ago. States it is worse when he is hungry. States he may make it better. States he also gets a bad taste in the back of his throat. States that tends to get worse when the pain gets worse. States over the last 2 days she has felt more dizzy and has had some black stool. States he felt lightheaded. States he has had bad gastric reflux but has been taking probiotics and sent that it helps. States that he has not been taking anything else for the abdominal pain but has been on a lot of Motrin for some of his other pains. No history of ulcers. No weight loss. No chest pain. No trouble breathing.  History reviewed. No pertinent past medical history.  There are no active problems to display for this patient.   History reviewed. No pertinent surgical history.     Home Medications    Prior to Admission medications   Medication Sig Start Date End Date Taking? Authorizing Provider  acidophilus (RISAQUAD) CAPS capsule Take 1 capsule by mouth every morning.    Yes Historical Provider, MD  Multiple Vitamin (MULTIVITAMIN WITH MINERALS) TABS tablet Take 1 tablet by mouth daily.   Yes Historical Provider, MD  PROAIR HFA 108 (90 Base) MCG/ACT inhaler Inhale 1-2 puffs into the lungs every 4 (four) hours as needed. Shortness of breath/ wheezing 04/17/15  Yes Historical Provider, MD  cephALEXin (KEFLEX) 250 MG capsule Take 250 mg by mouth 4 (four) times daily. 10 days 02/03/16   Historical Provider, MD  HYDROcodone-acetaminophen (NORCO/VICODIN) 5-325 MG tablet Take 1-2 tablets by mouth every 6 (six) hours as needed for moderate pain  or severe pain. Patient not taking: Reported on 02/04/2016 11/29/15   Junius FinnerErin O'Malley, PA-C  levofloxacin (LEVAQUIN) 500 MG tablet Take 500 mg by mouth daily. Reported on 05/11/2015 04/17/15   Historical Provider, MD  meloxicam (MOBIC) 7.5 MG tablet Take 1-2 tablets (7.5-15 mg total) by mouth daily. Take 2 tabs daily for 5 days, then 1-2 tabs daily as needed for pain Patient not taking: Reported on 02/04/2016 11/29/15   Junius FinnerErin O'Malley, PA-C  methocarbamol (ROBAXIN) 500 MG tablet Take 1 tablet (500 mg total) by mouth 2 (two) times daily. Patient not taking: Reported on 02/04/2016 11/29/15   Junius FinnerErin O'Malley, PA-C  omeprazole (PRILOSEC) 20 MG capsule Take 1 capsule (20 mg total) by mouth daily. 02/04/16   Benjiman CoreNathan Javarus Dorner, MD  predniSONE (DELTASONE) 20 MG tablet 3 tabs po day one, then 2 po daily x 4 days Patient not taking: Reported on 02/04/2016 11/29/15   Junius FinnerErin O'Malley, PA-C    Family History No family history on file.  Social History Social History  Substance Use Topics  . Smoking status: Current Every Day Smoker  . Smokeless tobacco: Never Used  . Alcohol use Yes     Allergies   Review of patient's allergies indicates no known allergies.   Review of Systems Review of Systems  Constitutional: Negative for appetite change and unexpected weight change.  HENT: Negative for dental problem, sinus pressure and sore  throat.   Respiratory: Negative for shortness of breath.   Cardiovascular: Negative for chest pain.  Gastrointestinal: Positive for abdominal pain. Negative for nausea and vomiting.  Genitourinary: Negative for flank pain.  Musculoskeletal: Negative for gait problem.  Neurological: Negative for light-headedness.  Psychiatric/Behavioral: Negative for behavioral problems and decreased concentration.     Physical Exam Updated Vital Signs BP 132/85 (BP Location: Left Arm)   Pulse 85   Temp 98.2 F (36.8 C) (Oral)   Resp 16   SpO2 98%   Physical Exam  Constitutional: He appears  well-developed.  HENT:  Head: Atraumatic.  Eyes: EOM are normal.  Neck: Neck supple.  Cardiovascular: Normal rate.   Pulmonary/Chest: Effort normal. He exhibits no tenderness.  Abdominal: Soft. There is no tenderness.  Musculoskeletal: He exhibits no edema.  Neurological: He is alert.  Skin: Skin is warm. Capillary refill takes less than 2 seconds.     ED Treatments / Results  Labs (all labs ordered are listed, but only abnormal results are displayed) Labs Reviewed  BASIC METABOLIC PANEL - Abnormal; Notable for the following:       Result Value   Glucose, Bld 101 (*)    All other components within normal limits  HEPATIC FUNCTION PANEL - Abnormal; Notable for the following:    AST 47 (*)    All other components within normal limits  CBG MONITORING, ED - Abnormal; Notable for the following:    Glucose-Capillary 102 (*)    All other components within normal limits  CBC  URINALYSIS, ROUTINE W REFLEX MICROSCOPIC (NOT AT [facility])  LIPASE, BLOOD  POC OCCULT BLOOD, ED    EKG  EKG Interpretation  Date/Time:  Thursday February 04 2016 07:46:55 EDT Ventricular Rate:  70 PR Interval:    QRS Duration: 89 QT Interval:  362 QTC Calculation: 391 R Axis:   75 Text Interpretation:  Sinus rhythm Borderline short PR interval Baseline wander in lead(s) V2 Confirmed by Rubin PayorPICKERING  MD, Harrold DonathNATHAN [phone]) on 02/04/2016 8:43:57 AM Also confirmed by Rubin PayorPICKERING  MD, Kemba Hoppes [phone]), editor Whitney PostLOGAN, Cala BradfordKIMBERLY [phone]([id number])  on 02/04/2016 9:54:43 AM       Radiology No results found.  Procedures Procedures (including critical care time)  Medications Ordered in ED Medications - No data to display   Initial Impression / Assessment and Plan / ED Course  I have reviewed the triage vital signs and the nursing notes.  Pertinent labs & imaging results that were available during my care of the patient were reviewed by me and considered in my medical decision making (see chart for details).  Clinical Course     Patient with pain in his upper abdomen when he does not eat. Labs overall reassuring. States he feels lightheaded but not orthostatic. States he had blood in the stool but after discussion patient refused a rectal exam. Hemoglobin is reassuring. Has GI follow-up. Lungs are clear. Will discharge home.  Final Clinical Impressions(s) / ED Diagnoses   Final diagnoses:  Pain of upper abdomen    New Prescriptions New Prescriptions   OMEPRAZOLE (PRILOSEC) 20 MG CAPSULE    Take 1 capsule (20 mg total) by mouth daily.     Benjiman CoreNathan El Pile, MD 02/04/16 [phone]

## 2016-02-04 NOTE — ED Triage Notes (Signed)
Pt presents to ED with multiple complaints. Pt primarily c/o R rib cage pain that worsens when his stomach is empty. Pt denies tenderness to palpation or tenderness with deep breathing. Pt sts this pain has been off and on for "weeks now." Pt also c/o dizziness x 2 days, sts he felt like he might pass out multiple times this morning. Pt also c/o bad taste in his mouth and no matter how much he scrapes his tongue it still remains. Pt sts he relates this to a possible ulcer. A&Ox4 and ambulatory.

## 2016-02-04 NOTE — Discharge Instructions (Signed)
-   Follow up with GI as planned

## 2016-02-19 ENCOUNTER — Encounter: Payer: Self-pay | Admitting: Gastroenterology

## 2016-02-19 ENCOUNTER — Ambulatory Visit (INDEPENDENT_AMBULATORY_CARE_PROVIDER_SITE_OTHER): Payer: BLUE CROSS/BLUE SHIELD | Admitting: Gastroenterology

## 2016-02-19 ENCOUNTER — Other Ambulatory Visit: Payer: Self-pay

## 2016-02-19 ENCOUNTER — Other Ambulatory Visit (INDEPENDENT_AMBULATORY_CARE_PROVIDER_SITE_OTHER): Payer: BLUE CROSS/BLUE SHIELD

## 2016-02-19 VITALS — BP 112/78 | HR 91 | Ht 66.0 in | Wt 201.0 lb

## 2016-02-19 DIAGNOSIS — R1011 Right upper quadrant pain: Secondary | ICD-10-CM

## 2016-02-19 DIAGNOSIS — R194 Change in bowel habit: Secondary | ICD-10-CM | POA: Diagnosis not present

## 2016-02-19 DIAGNOSIS — K219 Gastro-esophageal reflux disease without esophagitis: Secondary | ICD-10-CM

## 2016-02-19 HISTORY — DX: Gastro-esophageal reflux disease without esophagitis: K21.9

## 2016-02-19 LAB — HEPATIC FUNCTION PANEL
ALBUMIN: 4.8 g/dL (ref 3.5–5.2)
ALT: 31 U/L (ref 0–53)
AST: 27 U/L (ref 0–37)
Alkaline Phosphatase: 87 U/L (ref 39–117)
Bilirubin, Direct: 0.1 mg/dL (ref 0.0–0.3)
Total Bilirubin: 0.5 mg/dL (ref 0.2–1.2)
Total Protein: 7.6 g/dL (ref 6.0–8.3)

## 2016-02-19 LAB — IGA: IGA: 201 mg/dL (ref 68–378)

## 2016-02-19 LAB — H. PYLORI ANTIBODY, IGG: H PYLORI IGG: NEGATIVE

## 2016-02-19 MED ORDER — OMEPRAZOLE 20 MG PO CPDR: 20.0000 mg | DELAYED_RELEASE_CAPSULE | Freq: Every day | ORAL | 3 refills | Status: DC

## 2016-02-19 NOTE — Patient Instructions (Signed)
Your physician has requested that you go to the basement for the following lab work before leaving today: Hepatic function, IGA, TTG, H pylori IGG  We have sent the following medications to your pharmacy for you to pick up at your convenience: Omeprazole 20 mg daily  Please follow a low FODMAP diet. A handout has been given to you on this.  If you are age 32 or older, your body mass index should be between 23-30. Your Body mass index is 32.44 kg/m. If this is out of the aforementioned range listed, please consider follow up with your Primary Care Provider.  If you are age 32 or younger, your body mass index should be between 19-25. Your Body mass index is 32.44 kg/m. If this is out of the aformentioned range listed, please consider follow up with your Primary Care Provider.

## 2016-02-19 NOTE — Progress Notes (Signed)
HPI :  30 y/o male with a history of GERD here for a new patient evaluation of abdominal pain, reflux, and change in bowel habits.   He has had some discomfort in the RUQ, which occurred about 2 weeks ago along with symptoms of pyrosis and water brash. He was seen by urgent care for this issue and given prilosec. He reports for the past 3-4 years he has had some nocturnal regurgitation and pyrosis. He thinks things worsened significantly recently. Pain was located RUQ. He reported it was constant and worse prior to eating. He did not have any nausea or vomiting with this. He reported he was on ibuprofen for a long time but had taken less recently. He reports at this time he takes ibuprofen about twice per week for headaches or joint pains. He had been on prilosec 20mg  daily for 2 weeks. He reported it took away his symptoms for the most part and is feeling significantly improved - reports pain and heartburn has resolved.   He reports he has roughly 2 BM per day. Stools sometimes loose, sometimes constipated. He thinks loose stools about 70% of the time, some occasional urgency. He thinks he has some cramps / pains when this happened. Having a bowel movement will relieve the discomfort. He thinks this has a been ongoing as "long as he can remember". He does endorse some gas / bloating at baseline which can bother him at times. He denies any blood in the stools. He thinks weight is stable over the past year.   He denies any FH of Crohns or colitis, celiac disease. Grandmother had both gastric and colon cancer, no other relatives with CRC. He endorses tobacco use. He has weekend alcohol use, several drinks at a time.     Past Medical History:  Diagnosis Date  . Acid reflux      Past Surgical History:  Procedure Laterality Date  . HERNIA REPAIR     History reviewed. No pertinent family history.  Family history as per HPI above.  Social History  Substance Use Topics  . Smoking status:  Current Every Day Smoker  . Smokeless tobacco: Never Used  . Alcohol use Yes   Current Outpatient Prescriptions  Medication Sig Dispense Refill  . omeprazole (PRILOSEC) 20 MG capsule Take 1 capsule (20 mg total) by mouth daily. 14 capsule 0  . PROAIR HFA 108 (90 Base) MCG/ACT inhaler Inhale 1-2 puffs into the lungs every 4 (four) hours as needed. Shortness of breath/ wheezing  0   No current facility-administered medications for this visit.    No Known Allergies   Review of Systems: All systems reviewed and negative except where noted in HPI.    Lab Results  Component Value Date   WBC 5.8 02/04/2016   HGB 16.9 02/04/2016   HCT 48.8 02/04/2016   MCV 89.1 02/04/2016   PLT 204 02/04/2016    Lab Results  Component Value Date   CREATININE 0.87 02/04/2016   BUN 13 02/04/2016   NA 137 02/04/2016   K 4.9 02/04/2016   CL 107 02/04/2016   CO2 23 02/04/2016    Lab Results  Component Value Date   ALT 45 02/04/2016   AST 47 (H) 02/04/2016   ALKPHOS 81 02/04/2016   BILITOT 1.2 02/04/2016     Physical Exam: BP 112/78   Pulse 91   Ht 5\' 6"  (1.676 m)   Wt 201 lb (91.2 kg)   BMI 32.44 kg/m  Constitutional: Pleasant,well-developed, male  in no acute distress. HEENT: Normocephalic and atraumatic. Conjunctivae are normal. No scleral icterus. Neck supple.  Cardiovascular: Normal rate, regular rhythm.  Pulmonary/chest: Effort normal and breath sounds normal. No wheezing, rales or rhonchi. Abdominal: Soft, nondistended, nontender.  There are no masses palpable. No hepatomegaly. Extremities: no edema Lymphadenopathy: No cervical adenopathy noted. Neurological: Alert and oriented to person place and time. Skin: Skin is warm and dry. No rashes noted. Psychiatric: Normal mood and affect. Behavior is normal.   ASSESSMENT AND PLAN: 30 year old male here for assessment of the following issues:  GERD / upper abdominal pain - he clearly has symptoms of reflux over time which  worsened recently, also with new onset right upper quadrant pain in the setting of significant NSAID use historically. His possibly had NSAID gastritis or even peptic ulcer causing his pain. CBC was normal. His symptoms have resolved completely with a two-week course of Prilosec. We'll continue his Prilosec for another 2 weeks and he can stopping it and taking as needed. I will check H. pylori serology and treat if positive. Otherwise counseled him to avoid all NSAIDs if possible and use Tylenol as needed for headache or pain. He agreed and can follow-up as needed.  Change in bowel habits - I suspect he more than likely has irritable bowel syndrome which we discussed. Given his ongoing bloating and screen him for celiac disease. We otherwise discussed management options. He wants to avoid medications for this possible right now, we will treat with a low FODMAP diet which we discussed and I provided him a handout for this. During his blood work will also repeat LFTs given mild AST elevation noted on last lab draw. He can follow-up as needed for this issue moving forward.   Ileene PatrickSteven Armbruster, MD [facility] Pager [phone]

## 2016-02-22 LAB — TISSUE TRANSGLUTAMINASE, IGA: Tissue Transglutaminase Ab, IgA: 1 U/mL (ref <4)

## 2016-02-24 ENCOUNTER — Telehealth: Payer: Self-pay

## 2016-02-24 NOTE — Telephone Encounter (Signed)
-----   Message from Ruffin FrederickSteven Paul Armbruster, MD sent at 02/23/2016  8:31 AM EST ----- Morrie SheldonAshley can you please relay the following to this patient: - labs normal. Repeat LFTs normal. Celiac antibodies negative, H pylori testing negative - he should continue prilosec daily for another 2 weeks and then can use PRN - recommend he try low FODMAP diet as outlined in the clinic  He can follow up as needed if symptoms persist. Thanks

## 2016-02-24 NOTE — Telephone Encounter (Signed)
Pt informed of normal results. Instructed to take Prilosec another 2w then prn.

## 2016-03-04 HISTORY — PX: VASECTOMY: SHX75

## 2016-04-11 ENCOUNTER — Other Ambulatory Visit: Payer: Self-pay

## 2016-04-11 ENCOUNTER — Telehealth: Payer: Self-pay | Admitting: Gastroenterology

## 2016-04-11 MED ORDER — OMEPRAZOLE 20 MG PO CPDR: 20.0000 mg | DELAYED_RELEASE_CAPSULE | Freq: Every day | ORAL | 1 refills | Status: DC

## 2016-04-13 ENCOUNTER — Encounter: Payer: Self-pay | Admitting: Gastroenterology

## 2016-04-13 NOTE — Telephone Encounter (Signed)
Wonders if we can resend the medication refill to [facility] @ [address] 90day supply of omeprazole.

## 2016-04-14 ENCOUNTER — Other Ambulatory Visit: Payer: Self-pay

## 2016-04-14 MED ORDER — OMEPRAZOLE 20 MG PO CPDR: 20.0000 mg | DELAYED_RELEASE_CAPSULE | Freq: Every day | ORAL | 1 refills | Status: DC

## 2016-04-14 NOTE — Progress Notes (Unsigned)
Pt states that [facility] still does not have Rx. Omeprazole #90 sent by fax.

## 2016-06-16 ENCOUNTER — Encounter: Payer: Self-pay | Admitting: Physician Assistant

## 2016-06-16 ENCOUNTER — Other Ambulatory Visit (INDEPENDENT_AMBULATORY_CARE_PROVIDER_SITE_OTHER): Payer: BLUE CROSS/BLUE SHIELD

## 2016-06-16 ENCOUNTER — Other Ambulatory Visit: Payer: Self-pay

## 2016-06-16 ENCOUNTER — Ambulatory Visit (INDEPENDENT_AMBULATORY_CARE_PROVIDER_SITE_OTHER): Payer: BLUE CROSS/BLUE SHIELD | Admitting: Physician Assistant

## 2016-06-16 VITALS — BP 136/84 | HR 80 | Ht 66.0 in | Wt 206.5 lb

## 2016-06-16 DIAGNOSIS — R1011 Right upper quadrant pain: Secondary | ICD-10-CM

## 2016-06-16 DIAGNOSIS — R1031 Right lower quadrant pain: Secondary | ICD-10-CM | POA: Diagnosis not present

## 2016-06-16 DIAGNOSIS — R110 Nausea: Secondary | ICD-10-CM | POA: Diagnosis not present

## 2016-06-16 LAB — CBC WITH DIFFERENTIAL/PLATELET
BASOS ABS: 0 10*3/uL (ref 0.0–0.1)
Basophils Relative: 0.4 % (ref 0.0–3.0)
Eosinophils Absolute: 0.1 10*3/uL (ref 0.0–0.7)
Eosinophils Relative: 0.6 % (ref 0.0–5.0)
HCT: 50.2 % (ref 39.0–52.0)
Hemoglobin: 16.8 g/dL (ref 13.0–17.0)
LYMPHS ABS: 1.9 10*3/uL (ref 0.7–4.0)
Lymphocytes Relative: 23.8 % (ref 12.0–46.0)
MCHC: 33.5 g/dL (ref 30.0–36.0)
MCV: 90.8 fl (ref 78.0–100.0)
MONO ABS: 0.8 10*3/uL (ref 0.1–1.0)
MONOS PCT: 9.5 % (ref 3.0–12.0)
NEUTROS ABS: 5.3 10*3/uL (ref 1.4–7.7)
NEUTROS PCT: 65.7 % (ref 43.0–77.0)
PLATELETS: 220 10*3/uL (ref 150.0–400.0)
RBC: 5.52 Mil/uL (ref 4.22–5.81)
RDW: 12.6 % (ref 11.5–15.5)
WBC: 8 10*3/uL (ref 4.0–10.5)

## 2016-06-16 LAB — COMPREHENSIVE METABOLIC PANEL
ALT: 31 U/L (ref 0–53)
AST: 27 U/L (ref 0–37)
Albumin: 4.8 g/dL (ref 3.5–5.2)
Alkaline Phosphatase: 77 U/L (ref 39–117)
BILIRUBIN TOTAL: 0.3 mg/dL (ref 0.2–1.2)
BUN: 15 mg/dL (ref 6–23)
CO2: 26 meq/L (ref 19–32)
Calcium: 10.1 mg/dL (ref 8.4–10.5)
Chloride: 104 mEq/L (ref 96–112)
Creatinine, Ser: 0.82 mg/dL (ref 0.40–1.50)
GFR: 115.45 mL/min (ref >60.00)
GLUCOSE: 91 mg/dL (ref 70–99)
POTASSIUM: 4.1 meq/L (ref 3.5–5.1)
Sodium: 137 mEq/L (ref 135–145)
Total Protein: 7.6 g/dL (ref 6.0–8.3)

## 2016-06-16 LAB — AMYLASE: Amylase: 35 U/L (ref 27–131)

## 2016-06-16 LAB — SEDIMENTATION RATE: Sed Rate: 21 mm/hr — ABNORMAL HIGH (ref 0–15)

## 2016-06-16 LAB — LIPASE: Lipase: 25 U/L (ref 11.0–59.0)

## 2016-06-16 NOTE — Progress Notes (Signed)
Subjective:    Patient ID: Hector Thomas, male    DOB: 08/12/1985, 31 y.o.   MRN: [id number]  HPI  Hector Thomas is a pleasant 31 year old white male known to Dr. Havery Moros. He was last seen in the office in November 2017 after he had had an episode of right upper quadrant pain. He is also had intermittent reflux symptoms. At that time he was given a short course of omeprazole and then asked to use it as needed. Labs were checked including H. pylori antibody hepatic panel and celiac markers all of which were negative. Am's in today stating that he's been having recurrent right-sided abdominal pain over over the past week which is more intense than what he had in November. He describes it as a stabbing burning pain at onset and a pressure type feeling in his abdomen. He says is sick recurred about a week ago and has been constant since. Initially the pain was in his right upper quadrant and under his ribs on the right and has now moved down into his mid right abdomen. Had any documented fever or chills, no diarrhea no melena or hematochezia. He has had some nausea able to eat. He says is been eating very likely to in smaller amounts. She does not necessarily feel that by mouth intake makes his pain any worse. Says he generally hasn't felt well and his energy level has been decreased. No urinary symptoms. He does mention that he drinks fairly heavily on the weekends and worries about his liver.  Review of Systems Pertinent positive and negative review of systems were noted in the above HPI section.  All other review of systems was otherwise negative.  Outpatient Encounter Prescriptions as of 06/16/2016  Medication Sig  . omeprazole (PRILOSEC) 20 MG capsule Take 1 capsule (20 mg total) by mouth daily.  Marland Kitchen PROAIR HFA 108 (90 Base) MCG/ACT inhaler Inhale 1-2 puffs into the lungs every 4 (four) hours as needed. Shortness of breath/ wheezing   No facility-administered encounter medications on file as of 06/16/2016.     No Known Allergies Patient Active Problem List   Diagnosis Date Noted  . GERD (gastroesophageal reflux disease) 02/19/2016   Social History   Social History  . Marital status: Married    Spouse name: N/A  . Number of children: 3  . Years of education: N/A   Occupational History  . Vending machines    Social History Main Topics  . Smoking status: Current Every Day Smoker  . Smokeless tobacco: Never Used  . Alcohol use Yes     Comment: weekends beer and liquor  . Drug use: No  . Sexual activity: Not on file   Other Topics Concern  . Not on file   Social History Narrative  . No narrative on file    Hector Thomas family history includes Alcohol abuse in his father; Breast cancer in his maternal grandmother; Colon cancer in his maternal grandmother; Heart disease in his mother; Hypertension in his mother; Ovarian cancer in his maternal grandmother; Pancreatic cancer in his paternal grandmother; Stomach cancer in his maternal grandmother.      Objective:    Vitals:   06/16/16 1353  BP: 136/84  Pulse: 80    Physical Exam  well-developed young white male in no acute distress, blood pressure 136/84, pulse 80, height 5 foot 6, weight 206, BMI 33.3. HEENT; nontraumatic normocephalic EOMI PERRLA sclera anicteric, Cardiovascular ;regular rate and rhythm with S1-S2 no murmur or gallop, Pulmonary; clear  bilaterally, Abdomen ;soft, bowel sounds are present he is tender in the right upper quadrant right mid quadrant and right lower quadrant there is no guarding or rebound no palpable mass or hepatosplenomegaly, Rectal ;exam not done,Ext;no clubbing cyanosis or edema skin warm and dry, Neuropsych; mood and affect appropriate       Assessment & Plan:   #1 31 yo White male with 1 week history of right-sided abdominal pain, constant. At onset pain was in the right upper quadrant and has gradually moved into the mid abdomen. Patient had a similar but less severe episode in the fall  of 2017. Etiology is not clear When need to rule out cholecystitis, pancreatitis, or other acute intra-abdominal inflammatory process  #2 GERD  Plan; we'll check CBC with differential today, see met, amylase lipase and sedimentation rate Schedule for CT of the abdomen and pelvis with contrast tomorrow. He will  use Tylenol as needed for pain, offered an anti-emetic but does not feel that he needs it at present. Patient was advised that if he develops any acute worsening of his pain or fever of 101 or greater that he should go to the emergency room for evaluation.   Hector Raya S Gatlin Kittell PA-C 06/16/2016   Cc: No ref. provider found

## 2016-06-16 NOTE — Patient Instructions (Signed)
You have been scheduled for a CT scan of the abdomen and pelvis at [facility] ([address]---this is in the same building as Press [facility]).   You are scheduled on 06/17/16 at 2:30pm. You should arrive 15 minutes prior to your appointment time for registration. Please follow the written instructions below on the day of your exam:  WARNING: IF YOU ARE ALLERGIC TO IODINE/X-RAY DYE, PLEASE NOTIFY RADIOLOGY IMMEDIATELY AT [phone]! YOU WILL BE GIVEN A 13 HOUR PREMEDICATION PREP.  1) Do not eat or drink anything after 10:30AM (4 hours prior to your test) 2) You have been given 2 bottles of oral contrast to drink. The solution may taste  better if refrigerated, but do NOT add ice or any other liquid to this solution. Shake well before drinking.    Drink 1 bottle of contrast @ 12:30PM (2 hours prior to your exam)  Drink 1 bottle of contrast @ 1:30PM (1 hour prior to your exam)  You may take any medications as prescribed with a small amount of water except for the following: Metformin, Glucophage, Glucovance, Avandamet, Riomet, Fortamet, Actoplus Met, Janumet, Glumetza or Metaglip. The above medications must be held the day of the exam AND 48 hours after the exam.  The purpose of you drinking the oral contrast is to aid in the visualization of your intestinal tract. The contrast solution may cause some diarrhea. Before your exam is started, you will be given a small amount of fluid to drink. Depending on your individual set of symptoms, you may also receive an intravenous injection of x-ray contrast/dye. Plan on being at [facility] for 30 minutes or longer, depending on the type of exam you are having performed.  This test typically takes 30-45 minutes to complete.  If you have any questions regarding your exam or if you need to reschedule, you may call the CT department at [phone] between the hours of 8:00 am and 5:00 pm,  Monday-Friday.  ________________________________________________________________________  Please use a bland diet.  Your physician has requested that you go to the basement for the lab work before leaving today.   I appreciate the opportunity to care for you.

## 2016-06-17 ENCOUNTER — Telehealth: Payer: Self-pay

## 2016-06-17 ENCOUNTER — Ambulatory Visit (INDEPENDENT_AMBULATORY_CARE_PROVIDER_SITE_OTHER)
Admission: RE | Admit: 2016-06-17 | Discharge: 2016-06-17 | Disposition: A | Discharge status: Home / Self Care | Payer: BLUE CROSS/BLUE SHIELD | Source: Ambulatory Visit | Attending: Physician Assistant | Admitting: Physician Assistant

## 2016-06-17 DIAGNOSIS — R1011 Right upper quadrant pain: Secondary | ICD-10-CM

## 2016-06-17 DIAGNOSIS — R1031 Right lower quadrant pain: Secondary | ICD-10-CM

## 2016-06-17 DIAGNOSIS — R110 Nausea: Secondary | ICD-10-CM

## 2016-06-17 MED ORDER — IOPAMIDOL (ISOVUE-300) INJECTION 61%
100.0000 mL | Freq: Once | INTRAVENOUS | Status: AC | PRN
  Administered 2016-06-17: 100 mL via INTRAVENOUS

## 2016-06-17 NOTE — Telephone Encounter (Signed)
Spoke with pt and per Mike GipAmy Esterwood PA pts ct scan did not show a reason for his pain. Instructed pt to continue taking his prilosec daily, take tylenol for pain. No antibiotic is needed. Pt instructed to call us back on Monday to let us know how he is doing, Amy to speak with Dr. Adela LankArmbruster regarding plan of care going forward. Pt verbalized understanding.

## 2016-06-20 NOTE — Progress Notes (Signed)
Agree with assessment and plan as outlined.  

## 2016-11-19 ENCOUNTER — Other Ambulatory Visit: Payer: Self-pay | Admitting: Gastroenterology

## 2017-02-17 ENCOUNTER — Other Ambulatory Visit (INDEPENDENT_AMBULATORY_CARE_PROVIDER_SITE_OTHER): Payer: BLUE CROSS/BLUE SHIELD

## 2017-02-17 ENCOUNTER — Ambulatory Visit (INDEPENDENT_AMBULATORY_CARE_PROVIDER_SITE_OTHER): Payer: BLUE CROSS/BLUE SHIELD | Admitting: Gastroenterology

## 2017-02-17 ENCOUNTER — Encounter: Payer: Self-pay | Admitting: Gastroenterology

## 2017-02-17 VITALS — BP 130/82 | HR 86 | Ht 66.0 in | Wt 204.0 lb

## 2017-02-17 DIAGNOSIS — K219 Gastro-esophageal reflux disease without esophagitis: Secondary | ICD-10-CM

## 2017-02-17 DIAGNOSIS — R1011 Right upper quadrant pain: Secondary | ICD-10-CM

## 2017-02-17 DIAGNOSIS — R110 Nausea: Secondary | ICD-10-CM

## 2017-02-17 DIAGNOSIS — R112 Nausea with vomiting, unspecified: Secondary | ICD-10-CM

## 2017-02-17 LAB — COMPREHENSIVE METABOLIC PANEL
ALT: 26 U/L (ref 0–53)
AST: 24 U/L (ref 0–37)
Albumin: 4.4 g/dL (ref 3.5–5.2)
Alkaline Phosphatase: 69 U/L (ref 39–117)
BUN: 13 mg/dL (ref 6–23)
CHLORIDE: 103 meq/L (ref 96–112)
CO2: 27 meq/L (ref 19–32)
CREATININE: 0.86 mg/dL (ref 0.40–1.50)
Calcium: 9.6 mg/dL (ref 8.4–10.5)
GFR: 108.82 mL/min (ref >60.00)
GLUCOSE: 102 mg/dL — AB (ref 70–99)
POTASSIUM: 4.3 meq/L (ref 3.5–5.1)
SODIUM: 137 meq/L (ref 135–145)
Total Bilirubin: 0.4 mg/dL (ref 0.2–1.2)
Total Protein: 7.1 g/dL (ref 6.0–8.3)

## 2017-02-17 MED ORDER — ONDANSETRON HCL 4 MG PO TABS: 4.0000 mg | ORAL_TABLET | Freq: Four times a day (QID) | ORAL | 0 refills | Status: DC | PRN

## 2017-02-17 MED ORDER — OMEPRAZOLE 20 MG PO CPDR: 20.0000 mg | DELAYED_RELEASE_CAPSULE | Freq: Every day | ORAL | 1 refills | Status: DC | PRN

## 2017-02-17 NOTE — Progress Notes (Signed)
HPI :  31 year old male here for a follow-up visit. I previously saw him for symptoms of GERD and right upper quadrant pain. He had at that time responded to trial of PPI. He had negative H. pylori serology and celiac antibodies that were negative. He was seen again in March by Mike GipAmy Esterwood with ongoing pains in his right upper quadrant, CT scan 06/17/16 done showing normal exam  He states today he has nausea that is intermittent. He is not sure if it's related to eating it appears to occur sporadically, most frequently in the morning when he wakes up. He denies any vomiting. He continues to have some intermittent pains in his right upper quadrant. It is not seem to be related to any particular foods, nor is it reliably reproduced with eating. In fact he sometimes feels better after he eats. He is taking an NSAID perhaps once or twice a week at most, not routinely. He states the Prilosec he was using definitely resolved his heartburn and regurgitation. He has read about side effects of the kidneys and has stopped using routinely, wants to use it only as needed. States using daily Prilosec did not help his pain much otherwise. He denies any dysphagia.  He denies any FH of Crohns or colitis, celiac disease. Grandmother had both gastric and colon cancer, no other relatives with CRC.  Past Medical History:  Diagnosis Date  . Acid reflux   . RUQ pain      Past Surgical History:  Procedure Laterality Date  . HERNIA REPAIR    . VASECTOMY  03/2016  . WISDOM TOOTH EXTRACTION     Family History  Problem Relation Age of Onset  . Hypertension Mother   . Heart disease Mother   . Alcohol abuse Father   . Colon cancer Maternal Grandmother   . Stomach cancer Maternal Grandmother   . Ovarian cancer Maternal Grandmother   . Breast cancer Maternal Grandmother   . Pancreatic cancer Paternal Grandmother    Social History   Tobacco Use  . Smoking status: Current Every Day Smoker  . Smokeless  tobacco: Never Used  Substance Use Topics  . Alcohol use: Yes    Comment: weekends beer and liquor  . Drug use: No   Current Outpatient Medications  Medication Sig Dispense Refill  . omeprazole (PRILOSEC) 20 MG capsule Take 1 capsule (20 mg total) daily as needed by mouth. 90 capsule 1  . PROAIR HFA 108 (90 Base) MCG/ACT inhaler Inhale 1-2 puffs into the lungs every 4 (four) hours as needed. Shortness of breath/ wheezing  0  . ondansetron (ZOFRAN) 4 MG tablet Take 1 tablet (4 mg total) every 6 (six) hours as needed by mouth for nausea or vomiting. 20 tablet 0   No current facility-administered medications for this visit.    No Known Allergies   Review of Systems: All systems reviewed and negative except where noted in HPI.   Lab Results  Component Value Date   WBC 8.0 06/16/2016   HGB 16.8 06/16/2016   HCT 50.2 06/16/2016   MCV 90.8 06/16/2016   PLT 220.0 06/16/2016    Lab Results  Component Value Date   CREATININE 0.86 02/17/2017   BUN 13 02/17/2017   NA 137 02/17/2017   K 4.3 02/17/2017   CL 103 02/17/2017   CO2 27 02/17/2017    Lab Results  Component Value Date   ALT 26 02/17/2017   AST 24 02/17/2017   ALKPHOS 69 02/17/2017  BILITOT 0.4 02/17/2017     Physical Exam: BP 130/82   Pulse 86   Ht 5\' 6"  (1.676 m)   Wt 204 lb (92.5 kg)   SpO2 98%   BMI 32.93 kg/m  Constitutional: Pleasant,well-developed, male in no acute distress. HEENT: Normocephalic and atraumatic. Conjunctivae are normal. No scleral icterus. Neck supple.  Cardiovascular: Normal rate, regular rhythm.  Pulmonary/chest: Effort normal and breath sounds normal. No wheezing, rales or rhonchi. Abdominal: Soft, nondistended, nontender - could not reproduce symptoms. No appreciable hernia. There are no masses palpable. No hepatomegaly. Extremities: no edema Lymphadenopathy: No cervical adenopathy noted. Neurological: Alert and oriented to person place and time. Skin: Skin is warm and dry. No  rashes noted. Psychiatric: Normal mood and affect. Behavior is normal.   ASSESSMENT AND PLAN: 31 year old male here for reassessment of the following issues:  Right upper quadrant pain / nausea - unclear etiology. Prior testing for H. pylori and celiac is negative. CT scan unremarkable. His symptoms are not consistent with biliary colic, although possible. I think an EGD is next best test to evaluate his symptoms. I discussed risks and benefits of endoscopy and anesthesia with him and he wanted to proceed. I'll give him some Zofran to use as needed in the interim to see if this helps. If EGD is negative will consider ultrasound to rule out gallstones. LFTs normal. He agreed with the plan.  GERD - well-controlled on omeprazole as needed. He has several questions about the long-term risks of PPIs which we discussed at length. The risks of long term PPIs with current data include increased risk for chronic kidney disease, increased risk of fracture, increased risk of C diff, increased risk of pneumonia (short term usage), potentially increased risk of B12 / calcium deficiency, and rare risk of hypomagnesemia. Prior studies have suggested an association with increased risk of dementia and cardiovascular outcomes including stroke, although more recent studies argue against any significant association. The patient was counseled to use the lowest daily use of PPI needed to control symptoms. He asked that we repeat labs to check renal function, and it is normal.   Ileene PatrickSteven Armbruster, MD [facility] Pager [phone]

## 2017-02-17 NOTE — Patient Instructions (Addendum)
You have been scheduled for an endoscopy. Please follow written instructions given to you at your visit today. If you use inhalers (even only as needed), please bring them with you on the day of your procedure. Your physician has requested that you go to www.startemmi.com and enter the access code given to you at your visit today. This web site gives a general overview about your procedure. However, you should still follow specific instructions given to you by our office regarding your preparation for the procedure.  We have sent the following medications to your pharmacy for you to pick up at your convenience: Omeprazole 20mg  Zofran 4mg   Your physician has requested that you go to the basement for the following lab work before leaving today: CMET  If you are age 33 or older, your body mass index should be between 23-30. Your Body mass index is 32.93 kg/m. If this is out of the aforementioned range listed, please consider follow up with your Primary Care Provider.  If you are age 33 or younger, your body mass index should be between 19-25. Your Body mass index is 32.93 kg/m. If this is out of the aformentioned range listed, please consider follow up with your Primary Care Provider.   Thank you.

## 2017-03-06 ENCOUNTER — Encounter: Payer: BLUE CROSS/BLUE SHIELD | Admitting: Gastroenterology

## 2017-03-13 ENCOUNTER — Encounter: Payer: Self-pay | Admitting: Gastroenterology

## 2017-03-22 ENCOUNTER — Ambulatory Visit (AMBULATORY_SURGERY_CENTER): Payer: BLUE CROSS/BLUE SHIELD | Admitting: Gastroenterology

## 2017-03-22 ENCOUNTER — Encounter: Payer: Self-pay | Admitting: Gastroenterology

## 2017-03-22 VITALS — BP 125/87 | HR 85 | Temp 98.0°F | Resp 14 | Ht 66.0 in | Wt 204.0 lb

## 2017-03-22 DIAGNOSIS — R1011 Right upper quadrant pain: Secondary | ICD-10-CM | POA: Diagnosis not present

## 2017-03-22 DIAGNOSIS — K209 Esophagitis, unspecified without bleeding: Secondary | ICD-10-CM

## 2017-03-22 DIAGNOSIS — K219 Gastro-esophageal reflux disease without esophagitis: Secondary | ICD-10-CM | POA: Diagnosis not present

## 2017-03-22 DIAGNOSIS — K2950 Unspecified chronic gastritis without bleeding: Secondary | ICD-10-CM | POA: Diagnosis not present

## 2017-03-22 MED ORDER — SODIUM CHLORIDE 0.9 % IV SOLN: 500.0000 mL | Freq: Once | INTRAVENOUS | Status: AC

## 2017-03-22 NOTE — Patient Instructions (Signed)
Discharge instructions given. Biopsies taken. Resume previous medications. Handouts on Esophagitis and a hiatal hernia. YOU HAD AN ENDOSCOPIC PROCEDURE TODAY AT THE [facility]:   Refer to the procedure report that was given to you for any specific questions about what was found during the examination.  If the procedure report does not answer your questions, please call your gastroenterologist to clarify.  If you requested that your care partner not be given the details of your procedure findings, then the procedure report has been included in a sealed envelope for you to review at your convenience later.  YOU SHOULD EXPECT: Some feelings of bloating in the abdomen. Passage of more gas than usual.  Walking can help get rid of the air that was put into your GI tract during the procedure and reduce the bloating. If you had a lower endoscopy (such as a colonoscopy or flexible sigmoidoscopy) you may notice spotting of blood in your stool or on the toilet paper. If you underwent a bowel prep for your procedure, you may not have a normal bowel movement for a few days.  Please Note:  You might notice some irritation and congestion in your nose or some drainage.  This is from the oxygen used during your procedure.  There is no need for concern and it should clear up in a day or so.  SYMPTOMS TO REPORT IMMEDIATELY:    Following upper endoscopy (EGD)  Vomiting of blood or coffee ground material  New chest pain or pain under the shoulder blades  Painful or persistently difficult swallowing  New shortness of breath  Fever of 100F or higher  Black, tarry-looking stools  For urgent or emergent issues, a gastroenterologist can be reached at any hour by calling [phone].   DIET:  We do recommend a small meal at first, but then you may proceed to your regular diet.  Drink plenty of fluids but you should avoid alcoholic beverages for 24 hours.  ACTIVITY:  You should plan to take it easy  for the rest of today and you should NOT DRIVE or use heavy machinery until tomorrow (because of the sedation medicines used during the test).    FOLLOW UP: Our staff will call the number listed on your records the next business day following your procedure to check on you and address any questions or concerns that you may have regarding the information given to you following your procedure. If we do not reach you, we will leave a message.  However, if you are feeling well and you are not experiencing any problems, there is no need to return our call.  We will assume that you have returned to your regular daily activities without incident.  If any biopsies were taken you will be contacted by phone or by letter within the next 1-3 weeks.  Please call us at [phone] if you have not heard about the biopsies in 3 weeks.    SIGNATURES/CONFIDENTIALITY: You and/or your care partner have signed paperwork which will be entered into your electronic medical record.  These signatures attest to the fact that that the information above on your After Visit Summary has been reviewed and is understood.  Full responsibility of the confidentiality of this discharge information lies with you and/or your care-partner.

## 2017-03-22 NOTE — Progress Notes (Signed)
Called to room to assist during endoscopic procedure.  Patient ID and intended procedure confirmed with present staff. Received instructions for my participation in the procedure from the performing physician.  

## 2017-03-22 NOTE — Op Note (Signed)
[facility] Patient Name: Hector Thomas Procedure Date: 03/22/2017 10:11 AM MRN: [id number] Endoscopist: Viviann Spare P. Alexxander Kurt MD, MD Age: 31 Referring MD:  Date of Birth: [birth year] Gender: Male Account #: [id number] Procedure:                Upper GI endoscopy Indications:              Abdominal pain in the right upper quadrant,                            Heartburn Medicines:                Monitored Anesthesia Care Procedure:                Pre-Anesthesia Assessment:                           - Prior to the procedure, a History and Physical                            was performed, and patient medications and                            allergies were reviewed. The patient's tolerance of                            previous anesthesia was also reviewed. The risks                            and benefits of the procedure and the sedation                            options and risks were discussed with the patient.                            All questions were answered, and informed consent                            was obtained. Prior Anticoagulants: The patient has                            taken no previous anticoagulant or antiplatelet                            agents. ASA Grade Assessment: II - A patient with                            mild systemic disease. After reviewing the risks                            and benefits, the patient was deemed in                            satisfactory condition to undergo the procedure.  After obtaining informed consent, the endoscope was                            passed under direct vision. Throughout the                            procedure, the patient's blood pressure, pulse, and                            oxygen saturations were monitored continuously. The                            Endoscope was introduced through the mouth, and                            advanced to the second part of duodenum. The  upper                            GI endoscopy was accomplished without difficulty.                            The patient tolerated the procedure well. Scope In: Scope Out: Findings:                 Esophagogastric landmarks were identified: the                            Z-line was found at 36 cm, the gastroesophageal                            junction was found at 36 cm and the upper extent of                            the gastric folds was found at 39 cm from the                            incisors.                           A 3 cm hiatal hernia was present.                           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                           The exam of the esophagus was otherwise normal.                           The  entire examined stomach was normal. Biopsies                            were taken with a cold forceps for Helicobacter                            pylori testing.                           The duodenal bulb and second portion of the                            duodenum were normal. Complications:            No immediate complications. Estimated blood loss:                            Minimal. Estimated Blood Loss:     Estimated blood loss was minimal. Impression:               - Esophagogastric landmarks identified.                           - 3 cm hiatal hernia.                           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                           - Normal stomach. Biopsied.                           - Normal duodenal bulb and second portion of the                            duodenum.                           No overt cause for abdominal pain. Biopsies taken                            to rule out H pylori. Recommendation:           - Patient has a contact number  available for                            emergencies. The signs and symptoms of potential                            delayed complications were discussed with the                            patient. Return to normal activities tomorrow.  Written discharge instructions were provided to the                            patient.                           - Resume previous diet.                           - Continue present medications.                           - Await pathology results.                           - Consideration for [place] to evaluate for gallstones                            pending pathology results Viviann Spare P. Kendric Sindelar MD, MD 03/22/2017 10:41:01 AM This report has been signed electronically.

## 2017-03-22 NOTE — Progress Notes (Signed)
Report given to PACU, vss 

## 2017-03-23 ENCOUNTER — Telehealth: Payer: Self-pay

## 2017-03-23 NOTE — Telephone Encounter (Signed)
ollow up Call-  Call back number 03/22/2017  Post procedure Call Back phone  # [phone]  Permission to leave phone message Yes  Some recent data might be hidden     Patient questions:  Do you have a fever, pain , or abdominal swelling? No. Pain Score  0 *  Have you tolerated food without any problems? Yes.    Have you been able to return to your normal activities? Yes.    Do you have any questions about your discharge instructions: Diet   No. Medications  No. Follow up visit  No.  Do you have questions or concerns about your Care? No.  Actions: * If pain score is 4 or above: No action needed, pain <4.

## 2017-04-10 ENCOUNTER — Encounter: Payer: BLUE CROSS/BLUE SHIELD | Admitting: Gastroenterology

## 2017-04-28 ENCOUNTER — Telehealth: Payer: Self-pay | Admitting: Gastroenterology

## 2017-04-28 ENCOUNTER — Other Ambulatory Visit: Payer: Self-pay

## 2017-04-28 DIAGNOSIS — R1011 Right upper quadrant pain: Secondary | ICD-10-CM

## 2017-04-28 NOTE — Telephone Encounter (Signed)
Per EGD report and pathology note, if patient continued to have RUQ pain then schedule for RUQ US. Patient is still having pain and is scheduled for 05/05/17 at 10:30, [facility], he understands to be NPO after midnight.

## 2017-05-05 ENCOUNTER — Ambulatory Visit (HOSPITAL_COMMUNITY): Payer: BLUE CROSS/BLUE SHIELD

## 2017-05-25 ENCOUNTER — Ambulatory Visit (HOSPITAL_COMMUNITY)
Admission: RE | Admit: 2017-05-25 | Discharge: 2017-05-25 | Disposition: A | Discharge status: Home / Self Care | Payer: BLUE CROSS/BLUE SHIELD | Source: Ambulatory Visit | Attending: Gastroenterology | Admitting: Gastroenterology

## 2017-05-25 DIAGNOSIS — R1011 Right upper quadrant pain: Secondary | ICD-10-CM | POA: Insufficient documentation

## 2017-06-26 ENCOUNTER — Other Ambulatory Visit (INDEPENDENT_AMBULATORY_CARE_PROVIDER_SITE_OTHER): Payer: BLUE CROSS/BLUE SHIELD

## 2017-06-26 ENCOUNTER — Encounter: Payer: Self-pay | Admitting: Physician Assistant

## 2017-06-26 ENCOUNTER — Ambulatory Visit (INDEPENDENT_AMBULATORY_CARE_PROVIDER_SITE_OTHER): Payer: BLUE CROSS/BLUE SHIELD | Admitting: Physician Assistant

## 2017-06-26 VITALS — BP 116/72 | HR 84 | Ht 66.0 in | Wt 202.0 lb

## 2017-06-26 DIAGNOSIS — M545 Low back pain: Secondary | ICD-10-CM | POA: Diagnosis not present

## 2017-06-26 DIAGNOSIS — K219 Gastro-esophageal reflux disease without esophagitis: Secondary | ICD-10-CM | POA: Diagnosis not present

## 2017-06-26 DIAGNOSIS — R1011 Right upper quadrant pain: Secondary | ICD-10-CM

## 2017-06-26 DIAGNOSIS — M25559 Pain in unspecified hip: Secondary | ICD-10-CM | POA: Diagnosis not present

## 2017-06-26 LAB — URINALYSIS, ROUTINE W REFLEX MICROSCOPIC
Bilirubin Urine: NEGATIVE
Ketones, ur: NEGATIVE
LEUKOCYTES UA: NEGATIVE
Nitrite: NEGATIVE
Specific Gravity, Urine: 1.02 (ref 1.000–1.030)
TOTAL PROTEIN, URINE-UPE24: NEGATIVE
Urine Glucose: NEGATIVE
Urobilinogen, UA: 0.2 (ref 0.0–1.0)
pH: 5.5 (ref 5.0–8.0)

## 2017-06-26 LAB — COMPREHENSIVE METABOLIC PANEL
ALT: 21 U/L (ref 0–53)
AST: 22 U/L (ref 0–37)
Albumin: 3.8 g/dL (ref 3.5–5.2)
Alkaline Phosphatase: 87 U/L (ref 39–117)
BILIRUBIN TOTAL: 0.2 mg/dL (ref 0.2–1.2)
BUN: 12 mg/dL (ref 6–23)
CO2: 27 meq/L (ref 19–32)
CREATININE: 0.73 mg/dL (ref 0.40–1.50)
Calcium: 9.1 mg/dL (ref 8.4–10.5)
Chloride: 103 mEq/L (ref 96–112)
GFR: 131.2 mL/min (ref >60.00)
GLUCOSE: 137 mg/dL — AB (ref 70–99)
Potassium: 3.7 mEq/L (ref 3.5–5.1)
Sodium: 136 mEq/L (ref 135–145)
Total Protein: 6.7 g/dL (ref 6.0–8.3)

## 2017-06-26 LAB — CBC WITH DIFFERENTIAL/PLATELET
BASOS ABS: 0.1 10*3/uL (ref 0.0–0.1)
Basophils Relative: 0.9 % (ref 0.0–3.0)
Eosinophils Absolute: 0.1 10*3/uL (ref 0.0–0.7)
Eosinophils Relative: 1.1 % (ref 0.0–5.0)
HCT: 43.7 % (ref 39.0–52.0)
Hemoglobin: 15.3 g/dL (ref 13.0–17.0)
LYMPHS PCT: 34.2 % (ref 12.0–46.0)
Lymphs Abs: 2.1 10*3/uL (ref 0.7–4.0)
MCHC: 35 g/dL (ref 30.0–36.0)
MCV: 87.8 fl (ref 78.0–100.0)
Monocytes Absolute: 0.5 10*3/uL (ref 0.1–1.0)
Monocytes Relative: 8.3 % (ref 3.0–12.0)
NEUTROS ABS: 3.4 10*3/uL (ref 1.4–7.7)
NEUTROS PCT: 55.5 % (ref 43.0–77.0)
PLATELETS: 224 10*3/uL (ref 150.0–400.0)
RBC: 4.98 Mil/uL (ref 4.22–5.81)
RDW: 13.1 % (ref 11.5–15.5)
WBC: 6.2 10*3/uL (ref 4.0–10.5)

## 2017-06-26 MED ORDER — OMEPRAZOLE 20 MG PO CPDR: 20.0000 mg | DELAYED_RELEASE_CAPSULE | Freq: Every day | ORAL | 8 refills | Status: DC

## 2017-06-26 NOTE — Progress Notes (Signed)
Subjective:    Patient ID: Hector Thomas, male    DOB: [birth year], 32 y.o.   MRN: [id number]  HPI Hector Thomas is a pleasant 32 year old white male, known to Dr. Havery Thomas.  He was seen in November 2018 with right upper quadrant pain and nausea.  He had previously had Hector Thomas testing and celiac testing both of which were negative.  He underwent upper endoscopy in December 2018 with finding of a 3 cm hiatal hernia, and otherwise negative exam.  Biopsies also negative for Hector Thomas.  He apparently had called again with complaints of intermittent right upper quadrant discomfort and had ultrasound in February 2019 which showed no evidence of gallstones or gallbladder wall thickening, CBD of 4 mm and normal-appearing liver. He comes back in today stating that he feels that omeprazole helps his acid reflux symptoms but does not have any effect on his intermittent right upper quadrant pain. He says he has had this pain off and on for about a year.  He describes it as a burning type of sensation, and says that whenever he has this discomfort he generally does not feel well with lack of energy and a sensation of fatigue when the pain is present.  He has no radiation to his chest or into his back, no change in his bowel habits with these episodes and is not had any known injuries. Today he is also stating that about 2 weeks ago he started having low pelvic pain and very low back pain which she says was very uncomfortable for several days.  He describes it as a pressure type of pain that was worse with walking at times, or any kind of jarring motion.  He denies any associated dysuria, no fever or chills, no changes in bowel habits diarrhea melena or hematochezia.  No associated nausea.  He did say that it was uncomfortable to pass gas and he had some discomfort internally with straining for a bowel movement.  Over the past few days symptoms have eased up but have not completely resolved.  He did not have any radiation  into the testicles.  Review of Systems Pertinent positive and negative review of systems were noted in the above HPI section.  All other review of systems was otherwise negative.  Outpatient Encounter Medications as of 06/26/2017  Medication Sig  . Cholecalciferol (HM VITAMIN D3) 4000 units CAPS Take 1 capsule by mouth daily.  Marland Kitchen loratadine (ALLERGY) 10 MG tablet Take 10 mg by mouth daily.  Marland Kitchen omeprazole (PRILOSEC) 20 MG capsule Take 1 capsule (20 mg total) daily as needed by mouth.  . ondansetron (ZOFRAN) 4 MG tablet Take 1 tablet (4 mg total) every 6 (six) hours as needed by mouth for nausea or vomiting.  Marland Kitchen PROAIR HFA 108 (90 Base) MCG/ACT inhaler Inhale 1-2 puffs into the lungs every 4 (four) hours as needed. Shortness of breath/ wheezing  . omeprazole (PRILOSEC) 20 MG capsule Take 1 capsule (20 mg total) by mouth daily.   Facility-Administered Encounter Medications as of 06/26/2017  Medication  . 0.9 %  sodium chloride infusion   No Known Allergies Patient Active Problem List   Diagnosis Date Noted  . GERD (gastroesophageal reflux disease) 02/19/2016   Social History   Socioeconomic History  . Marital status: Married    Spouse name: Not on file  . Number of children: 3  . Years of education: Not on file  . Highest education level: Not on file  Occupational History  . Occupation:  Vending machines  Social Needs  . Financial resource strain: Not on file  . Food insecurity:    Worry: Not on file    Inability: Not on file  . Transportation needs:    Medical: Not on file    Non-medical: Not on file  Tobacco Use  . Smoking status: Current Every Day Smoker  . Smokeless tobacco: Never Used  Substance and Sexual Activity  . Alcohol use: Yes    Comment: weekends beer and liquor  . Drug use: No  . Sexual activity: Not on file  Lifestyle  . Physical activity:    Days per week: Not on file    Minutes per session: Not on file  . Stress: Not on file  Relationships  . Social  connections:    Talks on phone: Not on file    Gets together: Not on file    Attends religious service: Not on file    Active member of club or organization: Not on file    Attends meetings of clubs or organizations: Not on file    Relationship status: Not on file  . Intimate partner violence:    Fear of current or ex partner: Not on file    Emotionally abused: Not on file    Physically abused: Not on file    Forced sexual activity: Not on file  Other Topics Concern  . Not on file  Social History Narrative  . Not on file    Mr. Hector Thomas family history includes Alcohol abuse in his father; Breast cancer in his maternal grandmother; Colon cancer in his maternal grandmother; Heart disease in his mother; Hypertension in his mother; Ovarian cancer in his maternal grandmother; Pancreatic cancer in his paternal grandmother; Stomach cancer in his maternal grandmother.      Objective:    Vitals:   06/26/17 1405  BP: 116/72  Pulse: 84    Physical Exam; well-developed young white male in no acute distress, pleasant blood pressure 116/72 pulse 84, height 5 foot 6, weight 202, BMI 32.6.  HEENT; nontraumatic normocephalic EOMI PERRLA sclera anicteric, Cardiovascular ;regular rate and rhythm with S1-S2 no murmur rub or gallop, Pulmonary; clear bilaterally, Abdomen ;soft, he has some mild tenderness with deep palpation in the pelvis no guarding or rebound no mass or hepatosplenomegaly, bowel sounds are present, Rectal; exam patient declined, Extremities; no clubbing cyanosis or edema skin warm dry, Neuro psych; mood and affect appropriate       Assessment & Plan:   #69 32 year old white male with history of GERD-stable on Prilosec 20 mg p.o. Daily #2 intermittent burning right upper quadrant pain times 1 year of unclear etiology #3  2-3-week history of low pelvic and low back pain/pressure.  He did have some associated rectal discomfort, and worsening of symptoms with certain  movements. Etiology not clear, rule out proctitis, prostatitis, diverticulitis or other pelvic inflammatory process  Plan; CBC with differential, C met, UA CT of the abdomen and pelvis with contrast Continue Prilosec 20 mg p.o. every morning If CT is unrevealing and symptoms recur/persist he may need colonoscopy with Dr. Havery Thomas.   Izaan Kingbird S Dyamond Tolosa PA-C 06/26/2017   Cc: No ref. provider found

## 2017-06-26 NOTE — Progress Notes (Signed)
Agree with assessment and plan as outlined.  

## 2017-06-26 NOTE — Patient Instructions (Signed)
If you are age 34 or older, your body mass index should be between 23-30. Your Body mass index is 32.6 kg/m. If this is out of the aforementioned range listed, please consider follow up with your Primary Care Provider.  If you are age 61 or younger, your body mass index should be between 19-25. Your Body mass index is 32.6 kg/m. If this is out of the aformentioned range listed, please consider follow up with your Primary Care Provider.   We have sent the following medications to your pharmacy for you to pick up at your convenience: Omeprazole 86m by mouth once daily.  Your physician has requested that you go to the basement for the following lab work before leaving today: CBC CMET UA   You have been scheduled for a CT scan of the abdomen and pelvis at [id number]this is in the same building as [facility].   You are scheduled on 06/28/2017 at 4:00pm. You should arrive 15 minutes prior to your appointment time for registration. Please follow the written instructions below on the day of your exam:  WARNING: IF YOU ARE ALLERGIC TO IODINE/X-RAY DYE, PLEASE NOTIFY RADIOLOGY IMMEDIATELY AT [phone] YOU WILL BE GIVEN A 13 HOUR PREMEDICATION PREP.  1) Do not eat anything after 12:00pm (4 hours prior to your test) 2) You have been given 2 bottles of oral contrast to drink. The solution may taste better if refrigerated, but do NOT add ice or any other liquid to this solution. Shake well before drinking.    Drink 1 bottle of contrast @ 2:00pm (2 hours prior to your exam)  Drink 1 bottle of contrast @ 3:00pm (1 hour prior to your exam)  You may take any medications as prescribed with a small amount of water except for the following: Metformin, Glucophage, Glucovance, Avandamet, Riomet, Fortamet, Actoplus Met, Janumet, Glumetza or Metaglip. The above medications must be held the day of the exam AND 48 hours after the exam.  The purpose of you drinking  the oral contrast is to aid in the visualization of your intestinal tract. The contrast solution may cause some diarrhea. Before your exam is started, you will be given a small amount of fluid to drink. Depending on your individual set of symptoms, you may also receive an intravenous injection of x-ray contrast/dye. [facility], depending on the type of exam you are having performed.  If you have any questions regarding your exam or if you need to reschedule, you may call the CT department at [phone] the hours of 8:00 am and 5:00 pm, Monday-Friday.  ________________________________________________________________________

## 2017-06-28 ENCOUNTER — Inpatient Hospital Stay: Admission: RE | Admit: 2017-06-28 | Payer: BLUE CROSS/BLUE SHIELD | Source: Ambulatory Visit

## 2017-06-28 ENCOUNTER — Ambulatory Visit (INDEPENDENT_AMBULATORY_CARE_PROVIDER_SITE_OTHER)
Admission: RE | Admit: 2017-06-28 | Discharge: 2017-06-28 | Disposition: A | Discharge status: Home / Self Care | Payer: BLUE CROSS/BLUE SHIELD | Source: Ambulatory Visit | Attending: Physician Assistant | Admitting: Physician Assistant

## 2017-06-28 DIAGNOSIS — M545 Low back pain: Secondary | ICD-10-CM

## 2017-06-28 DIAGNOSIS — R1011 Right upper quadrant pain: Secondary | ICD-10-CM | POA: Diagnosis not present

## 2017-06-28 DIAGNOSIS — M25559 Pain in unspecified hip: Secondary | ICD-10-CM

## 2017-06-28 MED ORDER — IOPAMIDOL (ISOVUE-300) INJECTION 61%
100.0000 mL | Freq: Once | INTRAVENOUS | Status: AC | PRN
  Administered 2017-06-28: 100 mL via INTRAVENOUS

## 2017-06-30 ENCOUNTER — Telehealth: Payer: Self-pay | Admitting: Physician Assistant

## 2017-06-30 NOTE — Telephone Encounter (Signed)
Yes- please let him know the CT scan is negative for any cause of his sxs. He does have some fatty changes of liver . Please schedule him for colonoscopy

## 2017-06-30 NOTE — Telephone Encounter (Signed)
Pt has more questions regarding results.

## 2017-06-30 NOTE — Telephone Encounter (Signed)
Left message that I am returning his call. 

## 2017-06-30 NOTE — Telephone Encounter (Signed)
Patient called back to discuss "fatty liver". Discussed in depth the possible causes and suggested options to control going forward. This included diet and exercise. Patient's questions answered to his satisfaction.

## 2017-06-30 NOTE — Telephone Encounter (Signed)
CT report is in Epic. Do you want me to offer a colonoscopy?

## 2017-06-30 NOTE — Telephone Encounter (Signed)
I spoke with the pt and he is aware of the results.  He reports he is changing jobs and will be without insurance for 90 days, he will call to schedule when he has ins.  Reminder placed to contact pt in 990 days.

## 2017-06-30 NOTE — Telephone Encounter (Signed)
Patient wanting to know when results from CT done on Wednesday 3.27.19 will be back.

## 2017-07-10 ENCOUNTER — Other Ambulatory Visit: Payer: BLUE CROSS/BLUE SHIELD

## 2017-10-16 ENCOUNTER — Telehealth: Payer: Self-pay | Admitting: Physician Assistant

## 2017-10-16 NOTE — Telephone Encounter (Signed)
Pt is requesting to have some labs to check his liver, he stated that he usually gets labs twice a year.

## 2017-10-16 NOTE — Telephone Encounter (Signed)
Patient requests LFT's due to his fatty liver diagnosis. See the last CT scan.Advised he has had normal LFT twice over the past 8 months. Patient states he is not comfortable with this. He would like his liver functions checked.

## 2017-10-17 NOTE — Telephone Encounter (Signed)
Patient advised and agrees to these recommendations. Appointment scheduled for 12/12/17.

## 2017-10-17 NOTE — Telephone Encounter (Signed)
Patient had mild steatosis noted on prior CT (not seen on US) and his LFTs were normal in November and March. We usually check the LFTs once yearly in this situation. If he's anxious about it, the earliest I would recheck it would be Sept (about 6 months from last check). It may be better to bring him in to the clinic for an office visit to discuss fatty liver so I can discuss this issue further with him in more detail. Thanks

## 2017-12-12 ENCOUNTER — Ambulatory Visit: Payer: BLUE CROSS/BLUE SHIELD | Admitting: Gastroenterology

## 2019-09-02 ENCOUNTER — Encounter: Payer: Self-pay | Admitting: *Deleted

## 2019-09-02 ENCOUNTER — Ambulatory Visit
Admission: EM | Admit: 2019-09-02 | Discharge: 2019-09-02 | Disposition: A | Discharge status: Home / Self Care | Payer: Commercial Managed Care - PPO

## 2019-09-02 ENCOUNTER — Other Ambulatory Visit: Payer: Self-pay

## 2019-09-02 DIAGNOSIS — M5441 Lumbago with sciatica, right side: Secondary | ICD-10-CM

## 2019-09-02 DIAGNOSIS — M5442 Lumbago with sciatica, left side: Secondary | ICD-10-CM

## 2019-09-02 MED ORDER — MELOXICAM 15 MG PO TABS
15.0000 mg | ORAL_TABLET | Freq: Every day | ORAL | 1 refills | Status: DC
Start: 2019-09-02 — End: 2020-08-14

## 2019-09-02 MED ORDER — CYCLOBENZAPRINE HCL 10 MG PO TABS
10.0000 mg | ORAL_TABLET | Freq: Two times a day (BID) | ORAL | 0 refills | Status: DC | PRN
Start: 2019-09-02 — End: 2020-08-14

## 2019-09-02 NOTE — ED Triage Notes (Addendum)
C/O pain across low back while eating at work today without known injury.  States frequently has left hip and leg pain.  Pain worse with bending and movements.  Ambulates without difficulty.  Denies parasthesias.

## 2019-09-02 NOTE — ED Provider Notes (Signed)
[facility]    CSN: [id number] Arrival date & time: 09/02/19  1723      History   Chief Complaint Chief Complaint  Patient presents with  . Back Pain    HPI Richey Doolittle is a 34 y.o. male.       Back Pain: Patient presents for presents evaluation of low back problems.  No known injury. Pain worsened over the last few days.  He suffers from intermittent left leg and left hip pain. He has not had this problem evaluated. He has not taken anything for pain. Pain is worse with prolonged sitting and lifting. Patient is asymptomatic of any concerning neurological symptoms.   History reviewed. No pertinent past medical history.  There are no problems to display for this patient.   History reviewed. No pertinent surgical history.     Home Medications    Prior to Admission medications   Medication Sig Start Date End Date Taking? Authorizing Provider  Buprenorphine HCl-Naloxone HCl (SUBOXONE SL) Place under the tongue.   Yes [provider]  TESTOSTERONE ENANTHATE [place] Inject into the skin.   Yes [provider]    Family History Family History  Problem Relation Age of Onset  . Heart disease Mother   . Hypertension Mother     Social History Social History   Tobacco Use  . Smoking status: Former Smoker  Substance Use Topics  . Alcohol use: Yes  . Drug use: Not Currently    Comment: Rx drugs - clean x 2 yrs     Allergies   Patient has no known allergies.   Review of Systems Review of Systems Pertinent negatives listed in HPI  Physical Exam Triage Vital Signs ED Triage Vitals  Enc Vitals Group     BP 09/02/19 1741 130/86     Pulse Rate 09/02/19 1741 89     Resp 09/02/19 1741 16     Temp 09/02/19 1741 98.1 F (36.7 C)     Temp Source 09/02/19 1741 Oral     SpO2 09/02/19 1741 96 %     Weight --      Height --      Head Circumference --      Peak Flow --      Pain Score 09/02/19 1743 6     Pain Loc --      Pain Edu? --       Excl. in GC? --    No data found.  Updated Vital Signs BP 130/86   Pulse 89   Temp 98.1 F (36.7 C) (Oral)   Resp 16   SpO2 96%   Visual Acuity Right Eye Distance:   Left Eye Distance:   Bilateral Distance:    Right Eye Near:   Left Eye Near:    Bilateral Near:     Physical Exam HENT:     Head: Normocephalic.  Cardiovascular:     Rate and Rhythm: Normal rate and regular rhythm.  Pulmonary:     Effort: Pulmonary effort is normal.     Breath sounds: Normal breath sounds.  Musculoskeletal:     Lumbar back: Negative right straight leg raise test and negative left straight leg raise test.  Neurological:     Mental Status: He is alert.  Psychiatric:        Behavior: Behavior is cooperative.     Back Exam   Tenderness  The patient is experiencing tenderness in the thoracic and lumbar.  Range of Motion  Extension: normal  Flexion: normal  Lateral bend right: normal  Lateral bend left: normal   Muscle Strength  The patient has normal back strength.  Tests  Straight leg raise right: negative Straight leg raise left: negative     UC Treatments / Results  Labs (all labs ordered are listed, but only abnormal results are displayed) Labs Reviewed - No data to display  EKG   Radiology No results found.  Procedures Procedures (including critical care time)  Medications Ordered in UC Medications - No data to display  Initial Impression / Assessment and Plan / UC Course  I have reviewed the triage vital signs and the nursing notes.  Pertinent labs & imaging results that were available during my care of the patient were reviewed by me and considered in my medical decision making (see chart for details).    Meloxicam 15 mg once daily  PRN Cyclobenzaprine 10 mg BID PRN (cautioned regarding sedative effects of medication .  Follow-up with PCP if symptoms worsen or do not improve  Final Clinical Impressions(s) / UC Diagnoses   Final diagnoses:    Acute bilateral low back pain with bilateral sciatica   Discharge Instructions   None    ED Prescriptions    Medication Sig Dispense Auth. Provider   meloxicam (MOBIC) 15 MG tablet Take 1 tablet (15 mg total) by mouth daily. 30 tablet Scot Jun, FNP   cyclobenzaprine (FLEXERIL) 10 MG tablet Take 1 tablet (10 mg total) by mouth 2 (two) times daily as needed for muscle spasms. 20 tablet Scot Jun, FNP     PDMP not reviewed this encounter.   Scot Jun, [place] 09/06/19 [phone]

## 2019-10-30 ENCOUNTER — Other Ambulatory Visit: Payer: Self-pay

## 2019-10-30 ENCOUNTER — Emergency Department (HOSPITAL_COMMUNITY)
Admission: EM | Admit: 2019-10-30 | Discharge: 2019-10-30 | Disposition: A | Discharge status: Home / Self Care | Payer: Commercial Managed Care - PPO | Attending: Emergency Medicine | Admitting: Emergency Medicine

## 2019-10-30 ENCOUNTER — Encounter (HOSPITAL_COMMUNITY): Payer: Self-pay

## 2019-10-30 ENCOUNTER — Encounter (HOSPITAL_COMMUNITY): Payer: Self-pay | Admitting: *Deleted

## 2019-10-30 ENCOUNTER — Emergency Department (HOSPITAL_COMMUNITY)
Admission: EM | Admit: 2019-10-30 | Discharge: 2019-10-30 | Disposition: A | Discharge status: Home / Self Care | Payer: Commercial Managed Care - PPO | Source: Home / Self Care | Attending: Emergency Medicine | Admitting: Emergency Medicine

## 2019-10-30 DIAGNOSIS — K219 Gastro-esophageal reflux disease without esophagitis: Secondary | ICD-10-CM | POA: Insufficient documentation

## 2019-10-30 DIAGNOSIS — Z20822 Contact with and (suspected) exposure to covid-19: Secondary | ICD-10-CM | POA: Insufficient documentation

## 2019-10-30 DIAGNOSIS — J029 Acute pharyngitis, unspecified: Secondary | ICD-10-CM | POA: Diagnosis present

## 2019-10-30 DIAGNOSIS — R509 Fever, unspecified: Secondary | ICD-10-CM | POA: Insufficient documentation

## 2019-10-30 DIAGNOSIS — H9209 Otalgia, unspecified ear: Secondary | ICD-10-CM | POA: Insufficient documentation

## 2019-10-30 DIAGNOSIS — R1012 Left upper quadrant pain: Secondary | ICD-10-CM | POA: Insufficient documentation

## 2019-10-30 DIAGNOSIS — Z5321 Procedure and treatment not carried out due to patient leaving prior to being seen by health care provider: Secondary | ICD-10-CM | POA: Diagnosis not present

## 2019-10-30 DIAGNOSIS — Z87891 Personal history of nicotine dependence: Secondary | ICD-10-CM | POA: Insufficient documentation

## 2019-10-30 LAB — SARS CORONAVIRUS 2 BY RT PCR (HOSPITAL ORDER, PERFORMED IN ~~LOC~~ HOSPITAL LAB): SARS Coronavirus 2: NEGATIVE

## 2019-10-30 LAB — MONONUCLEOSIS SCREEN: Mono Screen: NEGATIVE

## 2019-10-30 MED ORDER — PREDNISONE 20 MG PO TABS: 40.0000 mg | ORAL_TABLET | Freq: Every day | ORAL | 0 refills | Status: DC

## 2019-10-30 NOTE — ED Triage Notes (Signed)
Patient reports starting antibiotics yesterday that were prescribed.   C/o left sided throat pain and left ear pain.

## 2019-10-30 NOTE — ED Triage Notes (Addendum)
Patient complaining of sore throat, chills, fever, and ear pain. Patient states that he had a fever on Sunday and Monday but yesterday he states his temp is dropping down to 96.0. strep and covid did at his doctor office and both were negative.

## 2019-10-30 NOTE — ED Notes (Signed)
Pt Left due to not being seen quick enough 

## 2019-10-30 NOTE — ED Triage Notes (Signed)
Pt arrives with symptoms of chills, fever, ear pain and throat pain/swelling since Sunday. Since then the patient has had two covid and strep test that were negative. Last tylenol about 2 hours ago.

## 2019-10-30 NOTE — ED Provider Notes (Signed)
[facility]-EMERGENCY DEPT Provider Note   CSN: [id number] Arrival date & time: 10/30/19  0703     History Chief Complaint  Patient presents with  . Sore Throat    Hector Thomas is a 34 y.o. male.  HPI Patient presents with sore throat, chills and generalized weakness.  Has had since Saturday or Sunday.  Has been seen at urgent care twice and had 2 - rapid Covid test sent to negative strep test.  He was however started on amoxicillin due to high clinical likelihood of strep.  Yesterday states he began to have chills.  States his temperature went down to 95.  States he would be shaking in bed needed blankets on him.  States he is normally warm person.  No nausea or vomiting.  Does have some mild left upper quadrant abdominal pain.  No rash.  No known sick contacts although he has a child in preschool who did not get sick but reportedly strep was going through the school.  Also has teenage children but they were not sick.  It hurts to swallow.  States he has had a decreased oral intake due to it.  States the pain is worse on the left side.    Past Medical History:  Diagnosis Date  . Acid reflux   . RUQ pain   . Vitamin D deficiency     Patient Active Problem List   Diagnosis Date Noted  . GERD (gastroesophageal reflux disease) 02/19/2016    Past Surgical History:  Procedure Laterality Date  . HERNIA REPAIR    . VASECTOMY  03/2016  . WISDOM TOOTH EXTRACTION         Family History  Problem Relation Age of Onset  . Heart disease Mother   . Hypertension Mother   . Alcohol abuse Father   . Colon cancer Maternal Grandmother   . Stomach cancer Maternal Grandmother   . Ovarian cancer Maternal Grandmother   . Breast cancer Maternal Grandmother   . Pancreatic cancer Paternal Grandmother     Social History   Tobacco Use  . Smoking status: Former Games developer  . Smokeless tobacco: Never Used  Vaping Use  . Vaping Use: Some days  Substance Use Topics    . Alcohol use: Yes    Comment: weekends beer and liquor  . Drug use: Not Currently    Comment: Rx drugs - clean x 2 yrs    Home Medications Prior to Admission medications   Medication Sig Start Date End Date Taking? Authorizing Provider  Buprenorphine HCl-Naloxone HCl (SUBOXONE SL) Place under the tongue.    [provider]  Cholecalciferol (HM VITAMIN D3) 4000 units CAPS Take 1 capsule by mouth daily.    [provider]  cyclobenzaprine (FLEXERIL) 10 MG tablet Take 1 tablet (10 mg total) by mouth 2 (two) times daily as needed for muscle spasms. 09/02/19   Bing Neighbors, FNP  loratadine (ALLERGY) 10 MG tablet Take 10 mg by mouth daily.    [provider]  meloxicam (MOBIC) 15 MG tablet Take 1 tablet (15 mg total) by mouth daily. 09/02/19   Bing Neighbors, FNP  omeprazole (PRILOSEC) 20 MG capsule Take 1 capsule (20 mg total) daily as needed by mouth. 02/17/17   Armbruster, Willaim Rayas, MD  omeprazole (PRILOSEC) 20 MG capsule Take 1 capsule (20 mg total) by mouth daily. 06/26/17   Esterwood, Amy S, PA-C  ondansetron (ZOFRAN) 4 MG tablet Take 1 tablet (4 mg total)  every 6 (six) hours as needed by mouth for nausea or vomiting. 02/17/17   Armbruster, Willaim Rayas, MD  predniSONE (DELTASONE) 20 MG tablet Take 2 tablets (40 mg total) by mouth daily. 10/30/19   Benjiman Core, MD  PROAIR HFA 108 [phone] Base) MCG/ACT inhaler Inhale 1-2 puffs into the lungs every 4 (four) hours as needed. Shortness of breath/ wheezing 04/17/15   [provider]  TESTOSTERONE ENANTHATE [place] Inject into the skin.    [provider]    Allergies    Patient has no known allergies.  Review of Systems   Review of Systems  Constitutional: Positive for appetite change and chills.  HENT: Positive for sore throat and trouble swallowing.   Respiratory: Negative for shortness of breath.   Gastrointestinal: Positive for abdominal pain. Negative for nausea and vomiting.   Genitourinary: Negative for flank pain.  Musculoskeletal: Negative for back pain.  Skin: Negative for rash.  Neurological: Negative for weakness.  Psychiatric/Behavioral: Negative for confusion.    Physical Exam Updated Vital Signs BP (!) 139/102 (BP Location: Left Arm)   Pulse 79   Temp 97.6 F (36.4 C) (Oral)   Resp 17   Ht 5\' 6"  (1.676 m)   Wt 88 kg   SpO2 98%   BMI 31.31 kg/m   Physical Exam Vitals and nursing note reviewed.  HENT:     Head: Atraumatic.     Mouth/Throat:     Tonsils: Tonsillar exudate present. No tonsillar abscesses.     Comments: Posterior pharyngeal erythema with bilateral tonsillar exudate with some tonsillar swelling.  Swelling appears slightly worse on the left compared to the right.  No peritonsillar swelling. Cardiovascular:     Rate and Rhythm: Normal rate.  Pulmonary:     Breath sounds: No wheezing, rhonchi or rales.  Abdominal:     General: There is no distension.     Tenderness: There is abdominal tenderness.     Comments: Minimal left upper quadrant abdominal tenderness.  No mass palpated.  Skin:    General: Skin is warm.     Capillary Refill: Capillary refill takes less than 2 seconds.  Neurological:     Mental Status: He is alert.     ED Results / Procedures / Treatments   Labs (all labs ordered are listed, but only abnormal results are displayed) Labs Reviewed  SARS CORONAVIRUS 2 BY RT PCR (HOSPITAL ORDER, PERFORMED IN [place] [facility])  MONONUCLEOSIS SCREEN    EKG None  Radiology No results found.  Procedures Procedures (including critical care time)  Medications Ordered in ED Medications - No data to display  ED Course  I have reviewed the triage vital signs and the nursing notes.  Pertinent labs & imaging results that were available during my care of the patient were reviewed by me and considered in my medical decision making (see chart for details).    MDM Rules/Calculators/A&P                           Patient with sore throat.  Despite negative strep test x2.  Clinically could be strep however.  Added mono screen which was negative.  Also Covid PCR done since it only had a rapid test.  Also negative.  Patient was mostly worried because of the hypothermia.  Discussed course of the disease and this can be a normal finding with the infection.  Does not appear septic at this time.  Discharge home.  Is on Suboxone for previous opiate abuse.  No opiate pain medicines. Final Clinical Impression(s) / ED Diagnoses Final diagnoses:  Pharyngitis, unspecified etiology    Rx / DC Orders ED Discharge Orders         Ordered    predniSONE (DELTASONE) 20 MG tablet  Daily     Discontinue  Reprint     10/30/19 0859           Benjiman Core, MD 10/30/19 [phone]

## 2020-01-14 ENCOUNTER — Encounter (HOSPITAL_COMMUNITY): Payer: Self-pay

## 2020-01-14 ENCOUNTER — Emergency Department (HOSPITAL_COMMUNITY)
Admission: EM | Admit: 2020-01-14 | Discharge: 2020-01-15 | Disposition: A | Discharge status: Home / Self Care | Payer: Commercial Managed Care - PPO | Attending: Emergency Medicine | Admitting: Emergency Medicine

## 2020-01-14 ENCOUNTER — Other Ambulatory Visit: Payer: Self-pay

## 2020-01-14 ENCOUNTER — Emergency Department (HOSPITAL_COMMUNITY): Payer: Commercial Managed Care - PPO

## 2020-01-14 DIAGNOSIS — Z87891 Personal history of nicotine dependence: Secondary | ICD-10-CM | POA: Diagnosis not present

## 2020-01-14 DIAGNOSIS — R079 Chest pain, unspecified: Secondary | ICD-10-CM | POA: Insufficient documentation

## 2020-01-14 DIAGNOSIS — Z20822 Contact with and (suspected) exposure to covid-19: Secondary | ICD-10-CM | POA: Diagnosis not present

## 2020-01-14 LAB — BASIC METABOLIC PANEL
Anion gap: 9 (ref 5–15)
BUN: 17 mg/dL (ref 6–20)
CO2: 26 mmol/L (ref 22–32)
Calcium: 9.1 mg/dL (ref 8.9–10.3)
Chloride: 103 mmol/L (ref 98–111)
Creatinine, Ser: 0.91 mg/dL (ref 0.61–1.24)
GFR, Estimated: >60 mL/min (ref >60)
Glucose, Bld: 107 mg/dL — ABNORMAL HIGH (ref 70–99)
Potassium: 3.7 mmol/L (ref 3.5–5.1)
Sodium: 138 mmol/L (ref 135–145)

## 2020-01-14 LAB — CBC
HCT: 47.3 % (ref 39.0–52.0)
Hemoglobin: 15.8 g/dL (ref 13.0–17.0)
MCH: 29.3 pg (ref 26.0–34.0)
MCHC: 33.4 g/dL (ref 30.0–36.0)
MCV: 87.8 fL (ref 80.0–100.0)
Platelets: 186 10*3/uL (ref 150–400)
RBC: 5.39 MIL/uL (ref 4.22–5.81)
RDW: 11.9 % (ref 11.5–15.5)
WBC: 5.2 10*3/uL (ref 4.0–10.5)
nRBC: 0 % (ref 0.0–0.2)

## 2020-01-14 LAB — RESPIRATORY PANEL BY RT PCR (FLU A&B, COVID)
Influenza A by PCR: NEGATIVE
Influenza B by PCR: NEGATIVE
SARS Coronavirus 2 by RT PCR: NEGATIVE

## 2020-01-14 LAB — TROPONIN I (HIGH SENSITIVITY): Troponin I (High Sensitivity): <2 ng/L (ref <18)

## 2020-01-14 NOTE — ED Notes (Signed)
Patient sleeping soundly in lobby

## 2020-01-14 NOTE — ED Provider Notes (Signed)
[facility]-EMERGENCY DEPT Provider Note   CSN: [id number] Arrival date & time: 01/14/20  1753     History Chief Complaint  Patient presents with   Chest Pain    Hector Thomas is a 34 y.o. male with PMH/o acid reflux, RUQ pain who presents for evaluation of chest pain that began at approximately 2pm this afternoon. 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 He said it was pain on the anterior part of his chest and then radiated into his left arm. He described it as a "dull ache and pressure" type pain. He did not get diaphoretic, nausea/vomiting, or SOB with it. He states that because it started spreading to his LUE he came to the ED. He reports since being here the pain has improved significantly and is almost gone. The pain was not worse with deep inspiration and was not worse with exertion. He took some pepcid but states that wasn't really affecting the pain. He states he has not felt well for the last few days. He has been tired and fatigued and felt like he didn't have any energy. He has had some sweats and chills episodes at home but has not measured any fevers. He is doing a colon cleanse and thought that might have something to do with it. He got one dose of his COVID vaccine. He does not know of any COVID exposure. He does not smoke. He doesn't have a history of HTN or DM. No personal cardiac history or family history of MI before the age of 54. He is on testosterone and estrogen blocker which he has been on for a year. No h/o PE, DVT, no recent leg swelling, no recent travel, surgeries, admissions, h/o HIV or cancer. He denies any cough, congestion, abdominal pain.   The history is provided by the patient.    HPI: A 34 year old patient with a history of obesity presents for evaluation of chest pain. Initial onset of pain was approximately 3-6 hours ago. The patient's chest pain is described as heaviness/pressure/tightness and  is not worse with exertion. The patient's chest pain is middle- or left-sided, is not well-localized, is not sharp and does radiate to the arms/jaw/neck. The patient does not complain of nausea and denies diaphoresis. The patient has no history of stroke, has no history of peripheral artery disease, has not smoked in the past 90 days, denies any history of treated diabetes, has no relevant family history of coronary artery disease (first degree relative at less than age 36), is not hypertensive and has no history of hypercholesterolemia.   Past Medical History:  Diagnosis Date   Acid reflux    RUQ pain    Vitamin D deficiency     Patient Active Problem List   Diagnosis Date Noted   GERD (gastroesophageal reflux disease) 02/19/2016    Past Surgical History:  Procedure Laterality Date   HERNIA REPAIR     VASECTOMY  03/2016   WISDOM TOOTH EXTRACTION         Family History  Problem Relation Age of Onset   Heart disease Mother    Hypertension Mother    Alcohol abuse Father    Colon cancer Maternal Grandmother    Stomach cancer Maternal Grandmother    Ovarian cancer Maternal Grandmother    Breast cancer Maternal Grandmother    Pancreatic cancer Paternal Grandmother     Social History   Tobacco Use   Smoking status:  Former Smoker   Smokeless tobacco: Never Used  Building services engineer Use: Some days   Substances: Nicotine  Substance Use Topics   Alcohol use: Not Currently   Drug use: Not Currently    Comment: Rx drugs - clean x 2 yrs    Home Medications Prior to Admission medications   Medication Sig Start Date End Date Taking? Authorizing Provider  anastrozole (ARIMIDEX) 1 MG tablet Take 1 mg by mouth every other day.  12/28/19  Yes [provider]  Buprenorphine HCl-Naloxone HCl 8-2 MG FILM Place under the tongue See admin instructions. Takes 2 in AM, 2 in afternoon and 1 in the evening 01/03/20  Yes [provider]  famotidine  (PEPCID) 40 MG tablet Take 40 mg by mouth daily. 11/18/19  Yes [provider]  testosterone cypionate (DEPOTESTOSTERONE CYPIONATE) 200 MG/ML injection Inject 200 mg into the muscle every 14 (fourteen) days. 11/20/19  Yes [provider]  TRULANCE 3 MG TABS Take 1 tablet by mouth daily as needed (IBS).  07/21/19  Yes [provider]  cyclobenzaprine (FLEXERIL) 10 MG tablet Take 1 tablet (10 mg total) by mouth 2 (two) times daily as needed for muscle spasms. Patient not taking: Reported on 01/14/2020 09/02/19   Bing Neighbors, FNP  meloxicam (MOBIC) 15 MG tablet Take 1 tablet (15 mg total) by mouth daily. Patient not taking: Reported on 01/14/2020 09/02/19   Bing Neighbors, FNP  omeprazole (PRILOSEC) 20 MG capsule Take 1 capsule (20 mg total) daily as needed by mouth. Patient not taking: Reported on 01/14/2020 02/17/17   Benancio Deeds, MD  omeprazole (PRILOSEC) 20 MG capsule Take 1 capsule (20 mg total) by mouth daily. Patient not taking: Reported on 01/14/2020 06/26/17   Esterwood, Amy S, PA-C  ondansetron (ZOFRAN) 4 MG tablet Take 1 tablet (4 mg total) every 6 (six) hours as needed by mouth for nausea or vomiting. Patient not taking: Reported on 01/14/2020 02/17/17   Benancio Deeds, MD  predniSONE (DELTASONE) 20 MG tablet Take 2 tablets (40 mg total) by mouth daily. Patient not taking: Reported on 01/14/2020 10/30/19   Benjiman Core, MD    Allergies    Patient has no known allergies.  Review of Systems   Review of Systems  Constitutional: Positive for chills, fatigue and fever.  Respiratory: Negative for cough and shortness of breath.   Cardiovascular: Positive for chest pain.  Gastrointestinal: Negative for abdominal pain, nausea and vomiting.  Genitourinary: Negative for dysuria and hematuria.  Neurological: Negative for headaches.  All other systems reviewed and are negative.   Physical Exam Updated Vital Signs BP 117/80    Pulse (!)  53    Temp 97.9 F (36.6 C) (Oral)    Resp (!) 5    Ht 5\' 6"  (1.676 m)    Wt 88.9 kg    SpO2 98%    BMI 31.64 kg/m   Physical Exam Vitals and nursing note reviewed.  Constitutional:      Appearance: Normal appearance. He is well-developed.     Comments: Sitting comfortably on bed, NAD  HENT:     Head: Normocephalic and atraumatic.  Eyes:     General: Lids are normal.     Conjunctiva/sclera: Conjunctivae normal.     Pupils: Pupils are equal, round, and reactive to light.  Cardiovascular:     Rate and Rhythm: Normal rate and regular rhythm.     Pulses: Normal pulses.     Heart sounds:  Normal heart sounds. No murmur heard.  No friction rub. No gallop.   Pulmonary:     Effort: Pulmonary effort is normal.     Breath sounds: Normal breath sounds.     Comments: Lungs clear to auscultation bilaterally.  Symmetric chest rise.  No wheezing, rales, rhonchi. Able to speak in full sentences without any diffilculty.  Abdominal:     Palpations: Abdomen is soft. Abdomen is not rigid.     Tenderness: There is no abdominal tenderness. There is no guarding.     Comments: Abdomen is soft, non-distended, non-tender. No rigidity, No guarding. No peritoneal signs.  Musculoskeletal:        General: Normal range of motion.     Cervical back: Full passive range of motion without pain.     Comments: BLE are symmetric in appearance without any overlying warmth, erythema, edema.   Skin:    General: Skin is warm and dry.     Capillary Refill: Capillary refill takes less than 2 seconds.  Neurological:     Mental Status: He is alert and oriented to person, place, and time.  Psychiatric:        Speech: Speech normal.     ED Results / Procedures / Treatments   Labs (all labs ordered are listed, but only abnormal results are displayed) Labs Reviewed  BASIC METABOLIC PANEL - Abnormal; Notable for the following components:      Result Value   Glucose, Bld 107 (*)    All other components within normal  limits  RESPIRATORY PANEL BY RT PCR (FLU A&B, COVID)  CBC  TROPONIN I (HIGH SENSITIVITY)  TROPONIN I (HIGH SENSITIVITY)  TROPONIN I (HIGH SENSITIVITY)    EKG None     EKG: Sinus rhythm, rate 57. PR 123, QTC 392. No ST elevation.  Radiology DG Chest 2 View  Result Date: 01/14/2020 CLINICAL DATA:  34 year old male with chest pain. EXAM: CHEST - 2 VIEW COMPARISON:  Chest radiograph dated 05/11/2015. FINDINGS: No focal consolidation, pleural effusion, pneumothorax. The cardiac silhouette is within limits. No acute osseous pathology. IMPRESSION: No active cardiopulmonary disease. Electronically Signed   By: Elgie Collard M.D.   On: 01/14/2020 19:18    Procedures Procedures (including critical care time)  Medications Ordered in ED Medications - No data to display  ED Course  I have reviewed the triage vital signs and the nursing notes.  Pertinent labs & imaging results that were available during my care of the patient were reviewed by me and considered in my medical decision making (see chart for details).    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  States it radiated into his left arm.  No shortness of breath, nausea/vomiting/diaphoresis.  He states that this is not worse with deep inspiration or with exertion.  He states symptoms have improved here in the ED.  Initially arrival, he is afebrile nontoxic-appearing.  Vital signs are stable.  Low suspicion for ACS etiology as it sounds atypical but it is a consideration.  History/physical exam is not concerning for PE.  He is on testosterone and estrogen blocker which she has been on for about a year.  He does not have any shortness of breath and does not have any pleuritic chest pain.  He otherwise does not have any provoking PE risk factors.  History/physical exam not concerning for  dissection.  He has equal pulses in both extremities.  We will plan to check labs, EKG, chest x-ray.  Additionally, he tells me he has been feeling fatigued, tired and feeling he does not have any energy over the last few days.  He has gotten 1 dose of his Covid vaccine.  No Covid exposure that he is and he knows of.  Will obtain a Covid test. Discussed patient with Dr. Rubin PayorPickering who is agreeable to plan.   Initial troponin is negative. CBC shows no leukocytosis, anemia. BMP is unremarkable. CXR shows no acute abnormalities.   Given his risk factors and history/physical exam, he has a HEART score of 2.   Delta troponin is negative.  His Covid test is negative.  Patient is sleeping comfortably on bed with no signs of distress.  He is hemodynamically stable.  I discussed results with patient.  At this time, given his reassuring EKG as well as 2 - troponins, feel that this is not representative of acute ACS.  He does state that he had seen cardiology about a year ago for some intermittent palpitations that he had been having.  He was placed on a monitor but they did not find anything.  We discussed about following up with the cardiologist he had seen before. At this time, patient exhibits no emergent life-threatening condition that require further evaluation in ED. Patient had ample opportunity for questions and discussion. All patient's questions were answered with full understanding. Strict return precautions discussed. Patient expresses understanding and agreement to plan.   Portions of this note were generated with Scientist, clinical (histocompatibility and immunogenetics)Dragon dictation software. Dictation errors may occur despite best attempts at proofreading.  Final Clinical Impression(s) / ED Diagnoses Final diagnoses:  Nonspecific chest pain    Rx / DC Orders ED Discharge Orders    None       Maxwell CaulLayden, Tanayah Squitieri A, PA-C 01/15/20 0043    Benjiman CorePickering, Nathan, MD 01/20/20 [phone]

## 2020-01-14 NOTE — ED Triage Notes (Addendum)
Patient c/o left chest pain that radiates into the left arm x 2 hours. Patient states, "I just do not feel well."  Patient states he has been doing a colon cleanse x 7 days.

## 2020-01-15 LAB — TROPONIN I (HIGH SENSITIVITY): Troponin I (High Sensitivity): <2 ng/L (ref <18)

## 2020-01-15 NOTE — Discharge Instructions (Signed)
As we discussed, your work-up today was reassuring.  Your lab work was reassuring and your chest x-ray was normal.  Your Covid test was negative.  As we discussed, it may be beneficial to follow-up with cardiology.  Follow-up with your primary care doctor as well.  Return emergency department for any worsening pain, difficulty breathing, nausea/vomiting or any other worsening concerning symptoms.

## 2020-08-14 ENCOUNTER — Emergency Department (HOSPITAL_COMMUNITY): Payer: Managed Care, Other (non HMO)

## 2020-08-14 ENCOUNTER — Other Ambulatory Visit: Payer: Self-pay

## 2020-08-14 ENCOUNTER — Emergency Department (HOSPITAL_COMMUNITY)
Admission: EM | Admit: 2020-08-14 | Discharge: 2020-08-14 | Disposition: A | Discharge status: Home / Self Care | Payer: Managed Care, Other (non HMO) | Attending: Emergency Medicine | Admitting: Emergency Medicine

## 2020-08-14 ENCOUNTER — Encounter (HOSPITAL_COMMUNITY): Payer: Self-pay | Admitting: Emergency Medicine

## 2020-08-14 DIAGNOSIS — R42 Dizziness and giddiness: Secondary | ICD-10-CM | POA: Diagnosis not present

## 2020-08-14 DIAGNOSIS — Z87891 Personal history of nicotine dependence: Secondary | ICD-10-CM | POA: Insufficient documentation

## 2020-08-14 DIAGNOSIS — R002 Palpitations: Secondary | ICD-10-CM | POA: Diagnosis present

## 2020-08-14 HISTORY — DX: Other psychoactive substance abuse, uncomplicated: F19.10

## 2020-08-14 LAB — BASIC METABOLIC PANEL
Anion gap: 4 — ABNORMAL LOW (ref 5–15)
BUN: 14 mg/dL (ref 6–20)
CO2: 28 mmol/L (ref 22–32)
Calcium: 9.5 mg/dL (ref 8.9–10.3)
Chloride: 105 mmol/L (ref 98–111)
Creatinine, Ser: 0.73 mg/dL (ref 0.61–1.24)
GFR, Estimated: >60 mL/min (ref >60)
Glucose, Bld: 161 mg/dL — ABNORMAL HIGH (ref 70–99)
Potassium: 4.2 mmol/L (ref 3.5–5.1)
Sodium: 137 mmol/L (ref 135–145)

## 2020-08-14 LAB — URINALYSIS, ROUTINE W REFLEX MICROSCOPIC
Bilirubin Urine: NEGATIVE
Glucose, UA: NEGATIVE mg/dL
Hgb urine dipstick: NEGATIVE
Ketones, ur: NEGATIVE mg/dL
Leukocytes,Ua: NEGATIVE
Nitrite: NEGATIVE
Protein, ur: NEGATIVE mg/dL
Specific Gravity, Urine: 1.005 (ref 1.005–1.030)
pH: 7 (ref 5.0–8.0)

## 2020-08-14 LAB — CBC WITH DIFFERENTIAL/PLATELET
Abs Immature Granulocytes: 0.04 10*3/uL (ref 0.00–0.07)
Basophils Absolute: 0 10*3/uL (ref 0.0–0.1)
Basophils Relative: 0 %
Eosinophils Absolute: 0 10*3/uL (ref 0.0–0.5)
Eosinophils Relative: 0 %
HCT: 47.3 % (ref 39.0–52.0)
Hemoglobin: 15.9 g/dL (ref 13.0–17.0)
Immature Granulocytes: 0 %
Lymphocytes Relative: 8 %
Lymphs Abs: 0.8 10*3/uL (ref 0.7–4.0)
MCH: 29.8 pg (ref 26.0–34.0)
MCHC: 33.6 g/dL (ref 30.0–36.0)
MCV: 88.6 fL (ref 80.0–100.0)
Monocytes Absolute: 0.5 10*3/uL (ref 0.1–1.0)
Monocytes Relative: 5 %
Neutro Abs: 8 10*3/uL — ABNORMAL HIGH (ref 1.7–7.7)
Neutrophils Relative %: 87 %
Platelets: 210 10*3/uL (ref 150–400)
RBC: 5.34 MIL/uL (ref 4.22–5.81)
RDW: 11.9 % (ref 11.5–15.5)
WBC: 9.3 10*3/uL (ref 4.0–10.5)
nRBC: 0 % (ref 0.0–0.2)

## 2020-08-14 LAB — CBG MONITORING, ED: Glucose-Capillary: 113 mg/dL — ABNORMAL HIGH (ref 70–99)

## 2020-08-14 LAB — TROPONIN I (HIGH SENSITIVITY): Troponin I (High Sensitivity): 3 ng/L (ref <18)

## 2020-08-14 MED ORDER — METOPROLOL TARTRATE 25 MG PO TABS: 25.0000 mg | ORAL_TABLET | Freq: Every day | ORAL | 0 refills | Status: DC

## 2020-08-14 NOTE — Discharge Instructions (Signed)
At this time there does not appear to be the presence of an emergent medical condition, however there is always the potential for conditions to change. Please read and follow the below instructions.  Please return to the Emergency Department immediately for any new or worsening symptoms or if your symptoms return. Please be sure to follow up with your Primary Care Provider within one week regarding your visit today; please call their office to schedule an appointment even if you are feeling better for a follow-up visit. You may begin taking metoprolol 25 mg daily for blood pressure control.  Do not take your other metoprolol medication (the 100 mg pill).  Please call the cardiologist today to schedule follow-up appointment and inform them of your new medication. Please also call your urologist today to discuss your Flomax and to see if this could be contributing to your symptoms.  Go to the nearest Emergency Department immediately if: You have fever or chills Have chest pain. Feel short of breath. Have a very bad headache. Feel dizzy. Pass out (faint). You have any new/concerning or worsening of symptoms.   Please read the additional information packets attached to your discharge summary.  Do not take your medicine if  develop an itchy rash, swelling in your mouth or lips, or difficulty breathing; call 911 and seek immediate emergency medical attention if this occurs.  You may review your lab tests and imaging results in their entirety on your MyChart account.  Please discuss all results of fully with your primary care provider and other specialist at your follow-up visit.  Note: Portions of this text may have been transcribed using voice recognition software. Every effort was made to ensure accuracy; however, inadvertent computerized transcription errors may still be present.

## 2020-08-14 NOTE — ED Provider Notes (Addendum)
[facility]-EMERGENCY DEPT Provider Note   CSN: [id number] Arrival date & time: 08/14/20  1139     History Chief Complaint  Patient presents with  . Dizziness    Hector Thomas is a 35 y.o. 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  Patient presents to the ER today for evaluation of palpitations, began this morning after waking at 6 AM.  Patient felt that his heart was "pounding" and racing he went to his primary care doctor's office and was told that he had a elevated heart rate around 120-130 and his blood pressure was elevated as well.  Patient reports that this caused him to feel lightheaded.  Patient reports that he takes metoprolol 100 mg as needed for high blood pressure, denies any previous diagnosis of SVT or other cardiac arrhythmias.  He reports that he had not taken metoprolol in the prior 2 months until today because he feels he did not need it.  Patient reports that around a week ago he went to his urologist office for some suprapubic fullness, he reports they did an evaluation including prostate exam and prescribed him Flomax which she has been taking and has noticed some increased urination over the past week.  Patient's suprapubic fullness sensation has resolved.  Patient denies fever/chills, fall/injury, chest pain, cough/hemoptysis, numbness/weakness, tingling, vision changes, neck pain, shortness of breath, dysuria/hematuria, recent illness or any additional concerns.  HPI     Past Medical History:  Diagnosis Date  . Acid reflux   . Drug abuse (HCC)   . RUQ pain   . Vitamin D deficiency     Patient Active Problem List   Diagnosis Date Noted  . GERD (gastroesophageal reflux disease) 02/19/2016    Past Surgical History:  Procedure Laterality Date  . HERNIA REPAIR    . VASECTOMY  03/2016  . WISDOM TOOTH EXTRACTION         Family History  Problem Relation Age of Onset  . Heart disease Mother   .  Hypertension Mother   . Alcohol abuse Father   . Colon cancer Maternal Grandmother   . Stomach cancer Maternal Grandmother   . Ovarian cancer Maternal Grandmother   . Breast cancer Maternal Grandmother   . Pancreatic cancer Paternal Grandmother     Social History   Tobacco Use  . Smoking status: Former Games developermoker  . Smokeless tobacco: Never Used  Vaping Use  . Vaping Use: Some days  . Substances: Nicotine  Substance Use Topics  . Alcohol use: Not Currently  . Drug use: Not Currently    Comment: Rx drugs - clean x 2 yrs    Home Medications Prior to Admission medications   Medication Sig Start Date End Date Taking? Authorizing Provider  Ascorbic Acid (VITAMIN C) 100 MG tablet Take 100 mg by mouth daily.   Yes [provider]  Buprenorphine HCl-Naloxone HCl 8-2 MG FILM Place 4 Film under the tongue 2 (two) times daily. 01/03/20  Yes [provider]  cholecalciferol (VITAMIN D3) 25 MCG (1000 UNIT) tablet Take 1,000 Units by mouth daily.   Yes [provider]  famotidine (PEPCID) 40 MG tablet Take 40 mg by mouth daily as needed for heartburn or indigestion. 11/18/19  Yes [provider]  metoprolol tartrate (LOPRESSOR) 25 MG tablet Take 1 tablet (25 mg total) by mouth daily. 08/14/20  Yes Harlene SaltsMorelli, Ilsa Bonello A, PA-C  tamsulosin (FLOMAX) 0.4 MG CAPS capsule Take 0.4 mg by mouth  daily.   Yes [provider]  testosterone cypionate (DEPOTESTOSTERONE CYPIONATE) 200 MG/ML injection Inject 100 mg into the muscle every 7 (seven) days. 11/20/19  Yes [provider]  zinc gluconate 50 MG tablet Take 50 mg by mouth daily.   Yes [provider]  metoprolol succinate (TOPROL-XL) 100 MG 24 hr tablet Take 100 mg by mouth daily as needed. Blood pressure 05/04/20 08/14/20 Yes [provider]  omeprazole (PRILOSEC) 20 MG capsule Take 1 capsule (20 mg total) by mouth daily. Patient not taking: No sig reported 06/26/17 08/14/20  Esterwood, Amy S,  PA-C    Allergies    Patient has no known allergies.  Review of Systems   Review of Systems Ten systems are reviewed and are negative for acute change except as noted in the HPI  Physical Exam Updated Vital Signs BP 109/86   Pulse 81   Temp 98.5 F (36.9 C) (Oral)   Resp 13   Ht 5\' 6"  (1.676 m)   Wt 86.2 kg   SpO2 96%   BMI 30.67 kg/m   Physical Exam Constitutional:      General: He is not in acute distress.    Appearance: Normal appearance. He is well-developed. He is not ill-appearing or diaphoretic.  HENT:     Head: Normocephalic and atraumatic.  Eyes:     General: Vision grossly intact. Gaze aligned appropriately.     Pupils: Pupils are equal, round, and reactive to light.  Neck:     Trachea: Trachea and phonation normal.  Cardiovascular:     Rate and Rhythm: Normal rate and regular rhythm.     Pulses: Normal pulses.     Heart sounds: Normal heart sounds.  Pulmonary:     Effort: Pulmonary effort is normal. No respiratory distress.     Breath sounds: Normal breath sounds.  Abdominal:     General: There is no distension.     Palpations: Abdomen is soft.     Tenderness: There is no abdominal tenderness. There is no guarding or rebound.  Musculoskeletal:        General: Normal range of motion.     Cervical back: Normal range of motion.  Skin:    General: Skin is warm and dry.  Neurological:     Mental Status: He is alert.     GCS: GCS eye subscore is 4. GCS verbal subscore is 5. GCS motor subscore is 6.     Comments: Speech is clear and goal oriented, follows commands Major Cranial nerves without deficit, no facial droop Normal strength in upper and lower extremities bilaterally including dorsiflexion and plantar flexion, strong and equal grip strength Sensation normal to light and sharp touch Moves extremities without ataxia, coordination intact Normal finger to nose and rapid alternating movements Neg romberg, no pronator drift Normal gait Normal  heel-shin and balance  Psychiatric:        Behavior: Behavior normal.     ED Results / Procedures / Treatments   Labs (all labs ordered are listed, but only abnormal results are displayed) Labs Reviewed  CBC WITH DIFFERENTIAL/PLATELET - Abnormal; Notable for the following components:      Result Value   Neutro Abs 8.0 (*)    All other components within normal limits  BASIC METABOLIC PANEL - Abnormal; Notable for the following components:   Glucose, Bld 161 (*)    Anion gap 4 (*)    All other components within normal limits  URINALYSIS, ROUTINE W REFLEX MICROSCOPIC -  Abnormal; Notable for the following components:   Color, Urine STRAW (*)    All other components within normal limits  CBG MONITORING, ED - Abnormal; Notable for the following components:   Glucose-Capillary 113 (*)    All other components within normal limits  TROPONIN I (HIGH SENSITIVITY)    EKG EKG Interpretation  Date/Time:  Friday Aug 14 2020 12:58:30 EDT Ventricular Rate:  99 PR Interval:  132 QRS Duration: 88 QT Interval:  327 QTC Calculation: 420 R Axis:   135 Text Interpretation: Sinus rhythm Left posterior fascicular block Confirmed by Kristine Royal [phone]) on 08/14/2020 1:32:11 PM   Radiology DG Chest Portable 1 View  Result Date: 08/14/2020 CLINICAL DATA:  Tachycardia, palpitations EXAM: PORTABLE CHEST - 1 VIEW COMPARISON:  01/14/2020 FINDINGS: The left costophrenic angle is not included on the study. The mediastinal contours are within normal limits. No cardiomegaly. The lungs are clear bilaterally without evidence of focal consolidation, pleural effusion, or pneumothorax. No acute osseous abnormality. IMPRESSION: No acute cardiopulmonary process. Electronically Signed   By: Marliss Coots MD   On: 08/14/2020 13:41    Procedures Procedures   Medications Ordered in ED Medications - No data to display  ED Course  I have reviewed the triage vital signs and the nursing notes.  Pertinent labs  & imaging results that were available during my care of the patient were reviewed by me and considered in my medical decision making (see chart for details).    MDM Rules/Calculators/A&P                         Additional history obtained from: 1. Nursing notes from this visit. 2. Review of electronic medical records. ------------------- I ordered, reviewed and interpreted labs which include: CBG 113 High-sensitivity troponin within normal limits.  No chest pain.  Onset of symptoms greater than 4 hours prior to blood draw; no indication for delta troponin Urinalysis without evidence of UTI.  No ketones or hemoglobin. BMP shows no emergent electrolyte derangement, AKI or gap. CBC shows no leukocytosis or anemia.  CXR:  IMPRESSION:  No acute cardiopulmonary process.   EKG: Sinus rhythm Left posterior fascicular block Confirmed by Kristine Royal [phone]) on 08/14/2020 1:32:11 PM - Patient's was slightly hypertensive on arrival this resolved without intervention.  Orthostatic vital signs were within normal limits.  On reassessment patient without tachycardia.  He is well-appearing no acute distress reports he is feeling improved.  No obvious cause of patient's palpitations at this time is not associated with any chest pain or shortness of breath.  Work-up is overall reassuring.  Additionally patient denies any family history of sudden cardiac death.  Patient is wondering if this is related to his Flomax, I advised that he call his urologist today to schedule a follow-up appointment to discuss this.  I also advised that the patient call his cardiologist today to discuss his symptoms and to schedule a follow-up appointment.  Patient reports that he was previously prescribed metoprolol 100 mg as needed for "high blood pressure".  I am unable to confirm this on chart review, will instead have patient start taking metoprolol 25 mg daily for blood pressure control.  I have advised that he call his  cardiologist today about medication changes and for follow-up blood pressure recheck and medication management  Low suspicion for ACS, PE, dissection or other emergent cardiopulmonary etiologies at this time.  Heart score less than 4.  Additionally low suspicion for CVA or  other emergent cause of lightheadedness.  He has normal neurologic exam.  Patient's case discussed with Dr. Rodena Medin who agrees with plan change metoprolol to 25 mg daily and to discharge with cardiology follow-up.    At this time there does not appear to be any evidence of an acute emergency medical condition and the patient appears stable for discharge with appropriate outpatient follow up. Diagnosis was discussed with patient who verbalizes understanding of care plan and is agreeable to discharge. I have discussed return precautions with patient who verbalizes understanding. Patient encouraged to follow-up with their PCP and cardiologist. All questions answered.    Note: Portions of this report may have been transcribed using voice recognition software. Every effort was made to ensure accuracy; however, inadvertent computerized transcription errors may still be present.  Final Clinical Impression(s) / ED Diagnoses Final diagnoses:  Palpitations    Rx / DC Orders ED Discharge Orders         Ordered    metoprolol tartrate (LOPRESSOR) 25 MG tablet  Daily        08/14/20 1515           Elizabeth Palau 08/14/20 1520    Elizabeth Palau 08/14/20 1523    Wynetta Fines, MD 08/15/20 1453

## 2020-08-14 NOTE — ED Triage Notes (Signed)
Patient reports waking up, feeling very tired and dizzy.  He reports that he was being seen at his Primary Care Doctor for lab work this morning and was told that his heart rate was faster than normal.  The patient reports that he has not taken Metoprolol in approx 2 months but took one this morning at home for his rapid heart rate. Denies chest pain but reports feeling tightness.  The patient is prescribed suboxone for narcotic abuse.

## 2020-08-14 NOTE — ED Notes (Signed)
PA at the bedside.

## 2020-10-31 ENCOUNTER — Other Ambulatory Visit: Payer: Self-pay

## 2020-10-31 ENCOUNTER — Emergency Department (HOSPITAL_BASED_OUTPATIENT_CLINIC_OR_DEPARTMENT_OTHER): Payer: Managed Care, Other (non HMO)

## 2020-10-31 ENCOUNTER — Observation Stay (HOSPITAL_BASED_OUTPATIENT_CLINIC_OR_DEPARTMENT_OTHER)
Admission: EM | Admit: 2020-10-31 | Discharge: 2020-11-02 | Disposition: A | Discharge status: Home / Self Care | Payer: Managed Care, Other (non HMO) | Attending: Emergency Medicine | Admitting: Emergency Medicine

## 2020-10-31 ENCOUNTER — Encounter (HOSPITAL_COMMUNITY): Payer: Self-pay | Admitting: Emergency Medicine

## 2020-10-31 ENCOUNTER — Emergency Department (HOSPITAL_COMMUNITY)
Admission: EM | Admit: 2020-10-31 | Discharge: 2020-10-31 | Disposition: A | Discharge status: Home / Self Care | Payer: Managed Care, Other (non HMO) | Source: Home / Self Care

## 2020-10-31 ENCOUNTER — Encounter (HOSPITAL_BASED_OUTPATIENT_CLINIC_OR_DEPARTMENT_OTHER): Payer: Self-pay | Admitting: Radiology

## 2020-10-31 DIAGNOSIS — R519 Headache, unspecified: Secondary | ICD-10-CM | POA: Insufficient documentation

## 2020-10-31 DIAGNOSIS — Z20822 Contact with and (suspected) exposure to covid-19: Secondary | ICD-10-CM | POA: Diagnosis not present

## 2020-10-31 DIAGNOSIS — M549 Dorsalgia, unspecified: Secondary | ICD-10-CM | POA: Insufficient documentation

## 2020-10-31 DIAGNOSIS — I10 Essential (primary) hypertension: Secondary | ICD-10-CM | POA: Insufficient documentation

## 2020-10-31 DIAGNOSIS — R5383 Other fatigue: Secondary | ICD-10-CM | POA: Insufficient documentation

## 2020-10-31 DIAGNOSIS — H539 Unspecified visual disturbance: Secondary | ICD-10-CM

## 2020-10-31 DIAGNOSIS — R42 Dizziness and giddiness: Secondary | ICD-10-CM | POA: Insufficient documentation

## 2020-10-31 DIAGNOSIS — R55 Syncope and collapse: Principal | ICD-10-CM | POA: Insufficient documentation

## 2020-10-31 DIAGNOSIS — Z87891 Personal history of nicotine dependence: Secondary | ICD-10-CM | POA: Diagnosis not present

## 2020-10-31 DIAGNOSIS — Z5321 Procedure and treatment not carried out due to patient leaving prior to being seen by health care provider: Secondary | ICD-10-CM | POA: Insufficient documentation

## 2020-10-31 DIAGNOSIS — R079 Chest pain, unspecified: Secondary | ICD-10-CM | POA: Insufficient documentation

## 2020-10-31 DIAGNOSIS — Z79899 Other long term (current) drug therapy: Secondary | ICD-10-CM | POA: Insufficient documentation

## 2020-10-31 LAB — COMPREHENSIVE METABOLIC PANEL
ALT: 21 U/L (ref 0–44)
AST: 21 U/L (ref 15–41)
Albumin: 4.4 g/dL (ref 3.5–5.0)
Alkaline Phosphatase: 66 U/L (ref 38–126)
Anion gap: 11 (ref 5–15)
BUN: 15 mg/dL (ref 6–20)
CO2: 28 mmol/L (ref 22–32)
Calcium: 9.6 mg/dL (ref 8.9–10.3)
Chloride: 101 mmol/L (ref 98–111)
Creatinine, Ser: 0.82 mg/dL (ref 0.61–1.24)
GFR, Estimated: >60 mL/min (ref >60)
Glucose, Bld: 100 mg/dL — ABNORMAL HIGH (ref 70–99)
Potassium: 3.7 mmol/L (ref 3.5–5.1)
Sodium: 140 mmol/L (ref 135–145)
Total Bilirubin: 0.5 mg/dL (ref 0.3–1.2)
Total Protein: 7 g/dL (ref 6.5–8.1)

## 2020-10-31 LAB — CBC WITH DIFFERENTIAL/PLATELET
Abs Immature Granulocytes: 0.01 10*3/uL (ref 0.00–0.07)
Basophils Absolute: 0 10*3/uL (ref 0.0–0.1)
Basophils Relative: 1 %
Eosinophils Absolute: 0.1 10*3/uL (ref 0.0–0.5)
Eosinophils Relative: 1 %
HCT: 47.9 % (ref 39.0–52.0)
Hemoglobin: 16.3 g/dL (ref 13.0–17.0)
Immature Granulocytes: 0 %
Lymphocytes Relative: 38 %
Lymphs Abs: 2.2 10*3/uL (ref 0.7–4.0)
MCH: 29.3 pg (ref 26.0–34.0)
MCHC: 34 g/dL (ref 30.0–36.0)
MCV: 86 fL (ref 80.0–100.0)
Monocytes Absolute: 0.4 10*3/uL (ref 0.1–1.0)
Monocytes Relative: 7 %
Neutro Abs: 3 10*3/uL (ref 1.7–7.7)
Neutrophils Relative %: 53 %
Platelets: 196 10*3/uL (ref 150–400)
RBC: 5.57 MIL/uL (ref 4.22–5.81)
RDW: 11.7 % (ref 11.5–15.5)
WBC: 5.7 10*3/uL (ref 4.0–10.5)
nRBC: 0 % (ref 0.0–0.2)

## 2020-10-31 LAB — URINALYSIS, ROUTINE W REFLEX MICROSCOPIC
Bilirubin Urine: NEGATIVE
Glucose, UA: NEGATIVE mg/dL
Hgb urine dipstick: NEGATIVE
Ketones, ur: NEGATIVE mg/dL
Leukocytes,Ua: NEGATIVE
Nitrite: NEGATIVE
Specific Gravity, Urine: 1.029 (ref 1.005–1.030)
pH: 6 (ref 5.0–8.0)

## 2020-10-31 LAB — TROPONIN I (HIGH SENSITIVITY)
Troponin I (High Sensitivity): 2 ng/L (ref <18)
Troponin I (High Sensitivity): 3 ng/L (ref <18)

## 2020-10-31 LAB — LIPASE, BLOOD: Lipase: 22 U/L (ref 11–51)

## 2020-10-31 LAB — BRAIN NATRIURETIC PEPTIDE: B Natriuretic Peptide: 6.4 pg/mL (ref 0.0–100.0)

## 2020-10-31 MED ORDER — IOHEXOL 350 MG/ML SOLN
75.0000 mL | Freq: Once | INTRAVENOUS | Status: AC | PRN
  Administered 2020-10-31: 75 mL via INTRAVENOUS

## 2020-10-31 NOTE — ED Provider Notes (Signed)
[facility] EMERGENCY DEPT Provider Note   CSN: [id number] Arrival date & time: 10/31/20  1601     History Chief Complaint  Patient presents with   Elevated BP    Hector Thomas is a 35 y.o. male.  The history is provided by the patient and medical records. No language interpreter was used.  Headache Pain location:  Frontal Quality:  Dull Radiates to:  Face Severity currently:  0/10 Severity at highest:  10/10 Onset quality:  Gradual Timing:  Intermittent Progression:  Waxing and waning Chronicity:  Recurrent Context comment:  Exertional and certain positions Worsened by:  Activity Ineffective treatments:  None tried Associated symptoms: blurred vision, dizziness, facial pain, fatigue, nausea, near-syncope, neck pain, visual change and vomiting   Associated symptoms: no abdominal pain, no back pain, no congestion, no cough, no diarrhea, no fever, no focal weakness, no loss of balance, no myalgias, no neck stiffness, no numbness, no photophobia, no seizures, no syncope and no weakness       Past Medical History:  Diagnosis Date   Acid reflux    Drug abuse (HCC)    RUQ pain    Vitamin D deficiency     Patient Active Problem List   Diagnosis Date Noted   GERD (gastroesophageal reflux disease) 02/19/2016    Past Surgical History:  Procedure Laterality Date   HERNIA REPAIR     VASECTOMY  03/2016   WISDOM TOOTH EXTRACTION         Family History  Problem Relation Age of Onset   Heart disease Mother    Hypertension Mother    Alcohol abuse Father    Colon cancer Maternal Grandmother    Stomach cancer Maternal Grandmother    Ovarian cancer Maternal Grandmother    Breast cancer Maternal Grandmother    Pancreatic cancer Paternal Grandmother     Social History   Tobacco Use   Smoking status: Former   Smokeless tobacco: Never  Building services engineer Use: Some days   Substances: Nicotine  Substance Use Topics   Alcohol use: Not Currently    Drug use: Not Currently    Comment: Rx drugs - clean x 2 yrs    Home Medications Prior to Admission medications   Medication Sig Start Date End Date Taking? Authorizing Provider  Ascorbic Acid (VITAMIN C) 100 MG tablet Take 100 mg by mouth daily.    [provider]  Buprenorphine HCl-Naloxone HCl 8-2 MG FILM Place 4 Film under the tongue 2 (two) times daily. 01/03/20   [provider]  cholecalciferol (VITAMIN D3) 25 MCG (1000 UNIT) tablet Take 1,000 Units by mouth daily.    [provider]  famotidine (PEPCID) 40 MG tablet Take 40 mg by mouth daily as needed for heartburn or indigestion. 11/18/19   [provider]  metoprolol tartrate (LOPRESSOR) 25 MG tablet Take 1 tablet (25 mg total) by mouth daily. 08/14/20   Harlene Salts A, PA-C  tamsulosin (FLOMAX) 0.4 MG CAPS capsule Take 0.4 mg by mouth daily.    [provider]  testosterone cypionate (DEPOTESTOSTERONE CYPIONATE) 200 MG/ML injection Inject 100 mg into the muscle every 7 (seven) days. 11/20/19   [provider]  zinc gluconate 50 MG tablet Take 50 mg by mouth daily.    [provider]  metoprolol succinate (TOPROL-XL) 100 MG 24 hr tablet Take 100 mg by mouth daily as needed. Blood pressure 05/04/20 08/14/20  [provider]  omeprazole (PRILOSEC) 20 MG capsule Take  1 capsule (20 mg total) by mouth daily. Patient not taking: No sig reported 06/26/17 08/14/20  Esterwood, Amy S, PA-C    Allergies    Patient has no known allergies.  Review of Systems   Review of Systems  Constitutional:  Positive for fatigue. Negative for chills and fever.  HENT:  Negative for congestion.   Eyes:  Positive for blurred vision and visual disturbance. Negative for photophobia.  Respiratory:  Negative for cough, chest tightness and wheezing.   Cardiovascular:  Positive for chest pain, palpitations and near-syncope. Negative for leg swelling and syncope.  Gastrointestinal:  Positive for  nausea and vomiting. Negative for abdominal pain, constipation and diarrhea.  Genitourinary:  Negative for dysuria, flank pain and frequency.  Musculoskeletal:  Positive for neck pain. Negative for back pain, myalgias and neck stiffness.  Skin:  Negative for rash and wound.  Neurological:  Positive for dizziness, light-headedness and headaches. Negative for focal weakness, seizures, syncope, speech difficulty, weakness, numbness and loss of balance.  Psychiatric/Behavioral:  Negative for agitation.   All other systems reviewed and are negative.  Physical Exam Updated Vital Signs BP (!) 170/90   Pulse 64   Temp 98.2 F (36.8 C) (Oral)   Resp 18   Ht 5\' 6"  (1.676 m)   Wt 85.3 kg   SpO2 100%   BMI 30.34 kg/m   Physical Exam Vitals and nursing note reviewed.  Constitutional:      General: He is not in acute distress.    Appearance: He is well-developed. He is not ill-appearing, toxic-appearing or diaphoretic.  HENT:     Head: Normocephalic and atraumatic.     Nose: No congestion or rhinorrhea.     Mouth/Throat:     Mouth: Mucous membranes are moist.     Pharynx: No oropharyngeal exudate or posterior oropharyngeal erythema.  Eyes:     Extraocular Movements: Extraocular movements intact.     Conjunctiva/sclera: Conjunctivae normal.     Pupils: Pupils are equal, round, and reactive to light.  Cardiovascular:     Rate and Rhythm: Normal rate and regular rhythm.     Heart sounds: No murmur heard. Pulmonary:     Effort: Pulmonary effort is normal. No respiratory distress.     Breath sounds: Normal breath sounds. No wheezing, rhonchi or rales.  Chest:     Chest wall: No tenderness.  Abdominal:     Palpations: Abdomen is soft.     Tenderness: There is no abdominal tenderness. There is no right CVA tenderness, left CVA tenderness, guarding or rebound.  Musculoskeletal:        General: No tenderness.     Cervical back: Neck supple. No tenderness.     Right lower leg: No edema.      Left lower leg: No edema.  Skin:    General: Skin is warm and dry.     Capillary Refill: Capillary refill takes less than 2 seconds.     Findings: No erythema.  Neurological:     General: No focal deficit present.     Mental Status: He is alert.     Sensory: No sensory deficit.     Motor: No weakness.  Psychiatric:        Mood and Affect: Mood normal.    ED Results / Procedures / Treatments   Labs (all labs ordered are listed, but only abnormal results are displayed) Labs Reviewed  URINALYSIS, ROUTINE W REFLEX MICROSCOPIC - Abnormal; Notable for the following components:  Result Value   Protein, ur TRACE (*)    All other components within normal limits  COMPREHENSIVE METABOLIC PANEL - Abnormal; Notable for the following components:   Glucose, Bld 100 (*)    All other components within normal limits  CBC WITH DIFFERENTIAL/PLATELET  LIPASE, BLOOD  BRAIN NATRIURETIC PEPTIDE  TSH  TROPONIN I (HIGH SENSITIVITY)  TROPONIN I (HIGH SENSITIVITY)    EKG EKG Interpretation  Date/Time:  Saturday October 31 2020 20:54:09 EDT Ventricular Rate:  54 PR Interval:  131 QRS Duration: 99 QT Interval:  409 QTC Calculation: 388 R Axis:   -28 Text Interpretation: Sinus rhythm Borderline left axis deviation When compared to prior, slower rate. No STEMI Confirmed by Theda Belfast ([id number]) on 10/31/2020 9:08:32 PM  Radiology CT Angio Head W or Wo Contrast  Result Date: 10/31/2020 CLINICAL DATA:  Initial evaluation for atypical headache, facial swelling and pressure. EXAM: CT ANGIOGRAPHY HEAD AND NECK TECHNIQUE: Multidetector CT imaging of the head and neck was performed using the standard protocol during bolus administration of intravenous contrast. Multiplanar CT image reconstructions and MIPs were obtained to evaluate the vascular anatomy. Carotid stenosis measurements (when applicable) are obtained utilizing NASCET criteria, using the distal internal carotid diameter as the denominator.  CONTRAST:  75mL OMNIPAQUE IOHEXOL 350 MG/ML SOLN COMPARISON:  None. FINDINGS: CT HEAD FINDINGS Brain: Cerebral volume within normal limits for patient age. No evidence for acute intracranial hemorrhage. No findings to suggest acute large vessel territory infarct. No mass lesion, midline shift, or mass effect. Ventricles are normal in size without evidence for hydrocephalus. No extra-axial fluid collection identified. 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 Vascular: No hyperdense vessel identified. Skull: Scalp soft tissues demonstrate no acute abnormality. Calvarium intact. Sinuses/Orbits: Globes and orbital soft tissues within normal limits. Visualized paranasal sinuses are clear. No mastoid effusion. CTA NECK FINDINGS Aortic arch: Visualized aortic arch normal in caliber with normal branch pattern. No stenosis or other abnormality seen about the origin the great vessels. Right carotid system: Right common and internal carotid arteries widely patent without stenosis, dissection or occlusion. Left carotid system: Left common and internal carotid arteries widely patent without stenosis, dissection or occlusion. Vertebral arteries: Both vertebral arteries arise from the subclavian arteries. No proximal subclavian artery stenosis. Dominant left vertebral artery with diffusely hypoplastic right vertebral artery noted. Both vertebral arteries widely patent without stenosis, dissection or occlusion. Skeleton: No visible acute osseous finding. No discrete or worrisome osseous lesions. Other neck: No other acute soft tissue abnormality within the neck. No mass or adenopathy. Upper chest: Visualized upper chest demonstrates no acute finding. Review of the MIP images confirms the above findings CTA HEAD FINDINGS Anterior circulation: Both internal carotid arteries widely patent to the termini without stenosis. A1 segments widely patent.  Normal anterior communicating artery complex. Both anterior cerebral arteries widely patent to their distal aspects without stenosis. No M1 stenosis or occlusion. Normal MCA bifurcations. Distal MCA branches well perfused and symmetric. Posterior circulation: Dominant left vertebral artery widely patent to the vertebrobasilar junction. Hypoplastic right vertebral artery terminates in PICA. Both PICA patent. Basilar patent to its distal aspect without stenosis. Superior cerebral arteries patent bilaterally. Both PCAs primarily supplied via the basilar well perfused to their distal aspects. Venous sinuses: Grossly patent allowing for timing the contrast bolus. Anatomic variants: Hypoplastic right vertebral artery terminates in PICA. No aneurysm or other vascular abnormality. Review of the MIP images confirms the above findings IMPRESSION:  1. Normal CTA of the head and neck. No large vessel occlusion, hemodynamically significant stenosis, dissection, or other acute vascular abnormality. 2. 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 3. Otherwise normal head CT. No other acute intracranial abnormality. Electronically Signed   By: Rise MuBenjamin  McClintock M.D.   On: 10/31/2020 23:22   CT Angio Neck W and/or Wo Contrast  Result Date: 10/31/2020 CLINICAL DATA:  Initial evaluation for atypical headache, facial swelling and pressure. EXAM: CT ANGIOGRAPHY HEAD AND NECK TECHNIQUE: Multidetector CT imaging of the head and neck was performed using the standard protocol during bolus administration of intravenous contrast. Multiplanar CT image reconstructions and MIPs were obtained to evaluate the vascular anatomy. Carotid stenosis measurements (when applicable) are obtained utilizing NASCET criteria, using the distal internal carotid diameter as the denominator. CONTRAST:  75mL OMNIPAQUE IOHEXOL 350 MG/ML SOLN COMPARISON:  None. FINDINGS: CT HEAD FINDINGS Brain: Cerebral volume within normal limits for patient  age. No evidence for acute intracranial hemorrhage. No findings to suggest acute large vessel territory infarct. No mass lesion, midline shift, or mass effect. Ventricles are normal in size without evidence for hydrocephalus. No extra-axial fluid collection identified. 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 Vascular: No hyperdense vessel identified. Skull: Scalp soft tissues demonstrate no acute abnormality. Calvarium intact. Sinuses/Orbits: Globes and orbital soft tissues within normal limits. Visualized paranasal sinuses are clear. No mastoid effusion. CTA NECK FINDINGS Aortic arch: Visualized aortic arch normal in caliber with normal branch pattern. No stenosis or other abnormality seen about the origin the great vessels. Right carotid system: Right common and internal carotid arteries widely patent without stenosis, dissection or occlusion. Left carotid system: Left common and internal carotid arteries widely patent without stenosis, dissection or occlusion. Vertebral arteries: Both vertebral arteries arise from the subclavian arteries. No proximal subclavian artery stenosis. Dominant left vertebral artery with diffusely hypoplastic right vertebral artery noted. Both vertebral arteries widely patent without stenosis, dissection or occlusion. Skeleton: No visible acute osseous finding. No discrete or worrisome osseous lesions. Other neck: No other acute soft tissue abnormality within the neck. No mass or adenopathy. Upper chest: Visualized upper chest demonstrates no acute finding. Review of the MIP images confirms the above findings CTA HEAD FINDINGS Anterior circulation: Both internal carotid arteries widely patent to the termini without stenosis. A1 segments widely patent. Normal anterior communicating artery complex. Both anterior cerebral arteries widely patent to their distal aspects without stenosis. No M1 stenosis or  occlusion. Normal MCA bifurcations. Distal MCA branches well perfused and symmetric. Posterior circulation: Dominant left vertebral artery widely patent to the vertebrobasilar junction. Hypoplastic right vertebral artery terminates in PICA. Both PICA patent. Basilar patent to its distal aspect without stenosis. Superior cerebral arteries patent bilaterally. Both PCAs primarily supplied via the basilar well perfused to their distal aspects. Venous sinuses: Grossly patent allowing for timing the contrast bolus. Anatomic variants: Hypoplastic right vertebral artery terminates in PICA. No aneurysm or other vascular abnormality. Review of the MIP images confirms the above findings IMPRESSION: 1. Normal CTA of the head and neck. No large vessel occlusion, hemodynamically significant stenosis, dissection, or other acute vascular abnormality. 2. 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 3. Otherwise normal head CT. No other acute intracranial abnormality. Electronically Signed   By: Rise MuBenjamin  McClintock M.D.   On: 10/31/2020 23:22   CT Chest W Contrast  Result Date: 10/31/2020 CLINICAL DATA:  Chest pain, cardiac cause suspected (Ped 0-18y). Concern  for possible SVC syndrome EXAM: CT CHEST WITH CONTRAST TECHNIQUE: Multidetector CT imaging of the chest was performed during intravenous contrast administration. CONTRAST:  79mL OMNIPAQUE IOHEXOL 350 MG/ML SOLN COMPARISON:  None. FINDINGS: Cardiovascular: Normal heart size. No significant pericardial effusion. The thoracic aorta is normal in caliber. No atherosclerotic plaque of the thoracic aorta. No coronary artery calcifications. The main pulmonary artery is normal in caliber. No central pulmonary embolus. Mediastinum/Nodes: No enlarged mediastinal, hilar, or axillary lymph nodes. Subcentimeter hypodensity within the right thyroid gland. Otherwise thyroid gland, trachea, and esophagus demonstrate no significant findings. Lungs/Pleura: Bilateral lower lobe  subsegmental atelectasis. No focal consolidation. No pulmonary nodule. No pulmonary mass. No pleural effusion. No pneumothorax. Upper Abdomen: No acute abnormality. Musculoskeletal: No chest wall abnormality. No suspicious lytic or blastic osseous lesions. No acute displaced fracture. Multilevel degenerative changes of the spine. IMPRESSION: No acute intrathoracic abnormality. Electronically Signed   By: Tish Frederickson M.D.   On: 10/31/2020 22:52    Procedures Procedures   Medications Ordered in ED Medications  iohexol (OMNIPAQUE) 350 MG/ML injection 75 mL (75 mLs Intravenous Contrast Given 10/31/20 2134)    ED Course  I have reviewed the triage vital signs and the nursing notes.  Pertinent labs & imaging results that were available during my care of the patient were reviewed by me and considered in my medical decision making (see chart for details).    MDM Rules/Calculators/A&P                           Hector Thomas is a 36 y.o. male with a past medical history significant for GERD and recent diagnosis of hypertension who presents with episodic headaches, dizziness, lightheadedness, vision changes, facial swelling, and feeling of congestion and pressure in his neck and head as well as occasional episodes of palpitations and some chest discomfort.  Patient reports that he has been on metoprolol for almost a year for elevated blood pressures and has been titrating it up with his PCP.  He reports no low blood pressures and his blood pressure is typically well controlled at rest but when he starts to exert himself or have position changes such as standing, he immediately has a jumping of his blood pressure from the 120s into the 170s and gets headache in his temples, face, and feels like his head and neck and face are swelling with congestion pressure and he gets episodes of dizziness, lightheadedness, and some darkened vision.  He denies any neck injury or head injury.  Denies fevers or chills.   Denies significant congestion, cough but does report he had some reflux-like chest discomfort with burning and palpitations.  He denies any leg pain or leg swelling.  Denies any extremity symptoms.  Denies any current back pain or abdominal pain.  On exam, lungs are clear and chest is nontender.  Abdomen is nontender.  Good pulses in extremities.  Patient was nontender and at rest, blood pressure was in the 120s.  When he stood with ED tech, patient blood pressure did go into the 170s and he was very symptomatic feeling lightheaded and dizzy and had to lay back down.  After he was laying down, heart rate was back into the 60s and 50s and blood pressures in the 120s.  No focal neurologic deficits on exam.  Pupils symmetric and reactive normal extraocular movements.  No carotid bruit appreciated.  No neck tenderness.  Patient has symmetric pulses and arms.  Exam  otherwise unremarkable.  Clinically I am concerned that patient may have some atypical problem causing his symptoms as this is somewhat opposite of orthostatic hypotension.  As his symptoms are with exertion and with position changes with standing, I am concerned that patient may have some abnormality such as a hypertrophic cardiomyopathy, SVC syndrome, or some obstructive pathology that is causing venous congestion in his head and neck and face brought on by exertion or position changes causing these headaches and elevated blood pressures.  I spoke with radiology who agrees with CT of the chest to look for SVC syndrome.  We will also get a CT of the head and neck as his symptoms are brought on by increased blood pressure with the vision changes dizziness and lightheadedness.  We will make sure there is no dissection or vascular abnormalities in the head or neck.  Will get other basic labs to look for other abnormalities.  Anticipate reassessment after work-up to determine disposition.  Work-up began to return showing nonelevated BNP.  Troponin  troponin negative.  TSH still in process.  Metabolic panel shows no acute kidney injury or electrolyte abnormality.  Urinalysis does not show convincing evidence of infection.  CBC reassuring.  EKG did not show arrhythmia or critical abnormality.  CT of the chest reassuring with no evidence of SVC syndrome or other abnormality.  Patient still awaiting results of CT head and neck.  If patient is still symptomatic on reassessment, patient may need admission for further cardiac work-up including echo.  If concerning findings are discovered on the CTs, anticipate disposition based on the findings.  If work-up is complete reassuring and symptoms have improved, he may be stable for discharge home with close follow-up and titration of blood pressure medication.  Care transferred to Dr. Eudelia Bunch while awaiting results of work-up.   Final Clinical Impression(s) / ED Diagnoses Final diagnoses:  Nonintractable headache, unspecified chronicity pattern, unspecified headache type  Transient vision disturbance  Near syncope  Episodic lightheadedness  Chest pain, unspecified type     Clinical Impression: 1. Nonintractable headache, unspecified chronicity pattern, unspecified headache type   2. Transient vision disturbance   3. Near syncope   4. Episodic lightheadedness   5. Chest pain, unspecified type     Disposition: Care transferred to Dr. Eudelia Bunch while awaiting results of work-up.  This note was prepared with assistance of Conservation officer, historic buildings. Occasional wrong-word or sound-a-like substitutions may have occurred due to the inherent limitations of voice recognition software.     Trei Schoch, Canary Brim, MD 10/31/20 [phone]

## 2020-10-31 NOTE — ED Notes (Signed)
Pt ambulated self to bathroom to provide urine specimen.

## 2020-10-31 NOTE — ED Notes (Signed)
NBP Rt Arm sitting 157/101 (120) NBP Lt Arm sitting 164/94 (113)

## 2020-10-31 NOTE — ED Triage Notes (Signed)
Patient complains of headache x2-3 days, getting worse, states it is throbbing. States his BP 150s-160s despite taking metoprolol. Tried IBU for HA w/o relief. Also endorses back pain and feeling 'really sick.'

## 2020-10-31 NOTE — ED Triage Notes (Signed)
States was dx with hypertension last Oct 21. Takes metoprolol, having temporal HA

## 2020-10-31 NOTE — ED Provider Notes (Signed)
Emergency Medicine Provider Triage Evaluation Note  Hector Thomas , a 35 y.o. male  was evaluated in triage.  Pt complains of hypertension.  Patient states that he was initially diagnosed with hypertension in October of last year.  He was started on metoprolol by his cardiologist which she has been compliant with.  About 3 days ago he began experiencing high blood pressure that ranges from the 150s to the 160s systolic.  He reports an associated circumferential headache that worsens with his elevated blood pressure.  No visual changes, numbness, tingling, or weakness.  Also reports some mild diffuse back pain.  No urinary complaints.  States that he started taking L-citrulline 2 weeks ago due to having "cold hands and feet".  His only other regular medication is Suboxone and he denies any recent changes in the dosage.  Physical Exam  BP (!) 169/92 (BP Location: Left Arm)   Pulse 77   Temp 99 F (37.2 C) (Oral)   Resp 16   Ht 5\' 6"  (1.676 m)   Wt 85.3 kg   SpO2 99%   BMI 30.34 kg/m  Gen:   Awake, no distress   Resp:  Normal effort  MSK:   Moves extremities without difficulty  Other:    Medical Decision Making  Medically screening exam initiated at 12:31 PM.  Appropriate orders placed.  was informed that the remainder of the evaluation will be completed by another provider, this initial triage assessment does not replace that evaluation, and the importance of remaining in the ED until their evaluation is complete.   Camie Patience, PA-C 10/31/20 1232    11/02/20, MD 10/31/20 [phone]

## 2020-10-31 NOTE — ED Notes (Signed)
Patient is resting comfortably. 

## 2020-11-01 ENCOUNTER — Observation Stay (HOSPITAL_COMMUNITY): Payer: Managed Care, Other (non HMO)

## 2020-11-01 DIAGNOSIS — I10 Essential (primary) hypertension: Secondary | ICD-10-CM | POA: Diagnosis not present

## 2020-11-01 DIAGNOSIS — R42 Dizziness and giddiness: Secondary | ICD-10-CM | POA: Diagnosis not present

## 2020-11-01 DIAGNOSIS — Z20822 Contact with and (suspected) exposure to covid-19: Secondary | ICD-10-CM | POA: Diagnosis not present

## 2020-11-01 DIAGNOSIS — Z87891 Personal history of nicotine dependence: Secondary | ICD-10-CM | POA: Diagnosis not present

## 2020-11-01 DIAGNOSIS — R55 Syncope and collapse: Secondary | ICD-10-CM | POA: Diagnosis present

## 2020-11-01 DIAGNOSIS — R519 Headache, unspecified: Secondary | ICD-10-CM

## 2020-11-01 DIAGNOSIS — R079 Chest pain, unspecified: Secondary | ICD-10-CM | POA: Diagnosis not present

## 2020-11-01 DIAGNOSIS — Z79899 Other long term (current) drug therapy: Secondary | ICD-10-CM | POA: Diagnosis not present

## 2020-11-01 DIAGNOSIS — R5383 Other fatigue: Secondary | ICD-10-CM | POA: Diagnosis not present

## 2020-11-01 DIAGNOSIS — M549 Dorsalgia, unspecified: Secondary | ICD-10-CM | POA: Diagnosis not present

## 2020-11-01 HISTORY — DX: Syncope and collapse: R55

## 2020-11-01 LAB — RESP PANEL BY RT-PCR (FLU A&B, COVID) ARPGX2
Influenza A by PCR: NEGATIVE
Influenza B by PCR: NEGATIVE
SARS Coronavirus 2 by RT PCR: NEGATIVE

## 2020-11-01 LAB — RAPID URINE DRUG SCREEN, HOSP PERFORMED
Amphetamines: NOT DETECTED
Barbiturates: NOT DETECTED
Benzodiazepines: NOT DETECTED
Cocaine: NOT DETECTED
Opiates: NOT DETECTED
Tetrahydrocannabinol: NOT DETECTED

## 2020-11-01 LAB — TSH: TSH: 3.295 u[IU]/mL (ref 0.350–4.500)

## 2020-11-01 MED ORDER — SODIUM CHLORIDE 0.9% FLUSH
3.0000 mL | Freq: Two times a day (BID) | INTRAVENOUS | Status: DC
  Administered 2020-11-01 – 2020-11-02 (×2): 3 mL via INTRAVENOUS

## 2020-11-01 MED ORDER — BUTALBITAL-APAP-CAFFEINE 50-325-40 MG PO TABS: 1.0000 | ORAL_TABLET | Freq: Four times a day (QID) | ORAL | Status: DC | PRN

## 2020-11-01 MED ORDER — BUPRENORPHINE HCL-NALOXONE HCL 8-2 MG SL FILM: 4.0000 | ORAL_FILM | Freq: Two times a day (BID) | SUBLINGUAL | Status: DC

## 2020-11-01 MED ORDER — AMLODIPINE BESYLATE 5 MG PO TABS
5.0000 mg | ORAL_TABLET | Freq: Every day | ORAL | Status: DC
  Administered 2020-11-01: 5 mg via ORAL
  Filled 2020-11-01: qty 1

## 2020-11-01 MED ORDER — BUPRENORPHINE HCL-NALOXONE HCL 8-2 MG SL SUBL
2.0000 | SUBLINGUAL_TABLET | Freq: Two times a day (BID) | SUBLINGUAL | Status: DC
  Administered 2020-11-02: 2 via SUBLINGUAL
  Filled 2020-11-01: qty 2

## 2020-11-01 MED ORDER — TAMSULOSIN HCL 0.4 MG PO CAPS
0.4000 mg | ORAL_CAPSULE | Freq: Every day | ORAL | Status: DC
  Administered 2020-11-01: 0.4 mg via ORAL
  Filled 2020-11-01: qty 1

## 2020-11-01 MED ORDER — FAMOTIDINE 20 MG PO TABS: 40.0000 mg | ORAL_TABLET | Freq: Every day | ORAL | Status: DC | PRN

## 2020-11-01 MED ORDER — ALPRAZOLAM 0.25 MG PO TABS: 0.2500 mg | ORAL_TABLET | Freq: Every evening | ORAL | Status: DC | PRN

## 2020-11-01 MED ORDER — BUPRENORPHINE HCL-NALOXONE HCL 8-2 MG SL SUBL
2.0000 | SUBLINGUAL_TABLET | Freq: Once | SUBLINGUAL | Status: AC
  Administered 2020-11-01: 2 via SUBLINGUAL
  Filled 2020-11-01: qty 2

## 2020-11-01 NOTE — Progress Notes (Signed)
Patient admitted to 5W04. No complaints of pain, VS stable, on RA. IV saline locked and flushing. Patient is independent, skin intact. Belongings include clothing, shoes, cell phone, wedding band, wallet. 1 and 3/4 strips Suboxone brought with patient and sent to pharmacy to be stored (signed paperwork in chart). Call bell given to patient. All questions answered at this time.

## 2020-11-01 NOTE — ED Provider Notes (Signed)
I assumed care of this patient.  Please see previous provider note for further details of Hx, PE.  Briefly patient is a 35 y.o. male who presented for headache, dizziness, and near syncope noted to have orthostatic hypertension. Pending CTA head and neck.  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 NSU (Dr. Maisie Fus) consulted and did not feel NSU involvement was necessary. Admitted to medicine for further cardiac work up       Nira Conn, MD 11/01/20 [id number]

## 2020-11-01 NOTE — H&P (Addendum)
History and Physical    Hector Thomas MPN:[id number] DOB: 26-Mar-1986 DOA: 10/31/2020  PCP: Chiquita Loth, PA (Confirm with patient/family/NH records and if not entered, this has to be entered at [facility] point of entry) Patient coming from: Home  I have personally briefly reviewed patient's old medical records in [facility]  Chief Complaint: Feeling better  HPI: Hector Thomas is a 35 y.o. male with medical history significant of recently diagnosed HTN, history of narcotic abuse now on Suboxone, anxiety disorder, presented with headache, dizzy and uncontrolled hypertension.  Patient has had uncontrolled anxiety, went to see PCP 2 months ago was found blood pressure significantly elevated.  PCP  was concerned about starting Benzos may have some unwanted effect (addiction?) since patient already on Suboxone, and with the significant elevation of blood pressure, PCP decided to start him on metoprolol 50 mg twice daily PRN.  Patient continues to experience anxiety episode sometimes panic attack especially in the afternoons, but he has been checking his blood pressure on every day and most occasions blood pressure SBP 110s in the morning but spiking to around 170-180s in the afternoon, so he has been using metoprolol on as needed bases.  Since 4 days ago, his blood pressure became poorly controlled, despite his being taking metoprolol twice daily for last 3 days.  Same time, experienced new onset of headache, aching-like constant on bilateral temporal and paranasal area, he thought it was sinusitis and's been using intra-nasal saline solution spray with partial relief.  No fever chills, neck pain, balance changes or coordination problems.  Yesterday afternoon, patient experienced significant lightheadedness and blurry vision he checked blood pressure at home significantly elevated again despite taking metoprolol.  And then he decided to come to the ED.  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  ED Course: Blood pressure significantly elevated, SBP 160s, DBP 110s.  Heart rate upper 50s.  He had showed type I Chiari variations.  Neurosurgery consulted who ordered brain MRI.  Review of Systems: As per HPI otherwise 14 point review of systems negative.    Past Medical History:  Diagnosis Date   Acid reflux    Drug abuse (HCC)    RUQ pain    Vitamin D deficiency     Past Surgical History:  Procedure Laterality Date   HERNIA REPAIR     VASECTOMY  03/2016   WISDOM TOOTH EXTRACTION       reports that he has quit smoking. He has never used smokeless tobacco. He reports previous alcohol use. He reports previous drug use.  No Known Allergies  Family History  Problem Relation Age of Onset   Heart disease Mother    Hypertension Mother    Alcohol abuse Father    Colon cancer Maternal Grandmother    Stomach cancer Maternal Grandmother    Ovarian cancer Maternal Grandmother    Breast cancer Maternal Grandmother    Pancreatic cancer Paternal Grandmother      Prior to Admission medications   Medication Sig Start Date End Date Taking? Authorizing Provider  Ascorbic Acid (VITAMIN C) 100 MG tablet Take 100 mg by mouth daily.    [provider]  Buprenorphine HCl-Naloxone HCl 8-2 MG FILM Place 4 Film under the tongue 2 (two) times daily. 01/03/20   [provider]  cholecalciferol (VITAMIN D3) 25 MCG (1000 UNIT) tablet Take 1,000 Units by mouth daily.  [provider]  famotidine (PEPCID) 40 MG tablet Take 40 mg by mouth daily as needed for heartburn or indigestion. 11/18/19   [provider]  metoprolol tartrate (LOPRESSOR) 25 MG tablet Take 1 tablet (25 mg total) by mouth daily. 08/14/20   Harlene Salts A, PA-C  tamsulosin (FLOMAX) 0.4 MG CAPS capsule Take 0.4 mg by mouth daily.    [provider]  testosterone cypionate (DEPOTESTOSTERONE  CYPIONATE) 200 MG/ML injection Inject 100 mg into the muscle every 7 (seven) days. 11/20/19   [provider]  zinc gluconate 50 MG tablet Take 50 mg by mouth daily.    [provider]  metoprolol succinate (TOPROL-XL) 100 MG 24 hr tablet Take 100 mg by mouth daily as needed. Blood pressure 05/04/20 08/14/20  [provider]  omeprazole (PRILOSEC) 20 MG capsule Take 1 capsule (20 mg total) by mouth daily. Patient not taking: No sig reported 06/26/17 08/14/20  Sammuel Cooper, PA-C    Physical Exam: Vitals:   11/01/20 0900 11/01/20 1236 11/01/20 1300 11/01/20 1517  BP: 103/74 128/89 139/78 (!) 142/86  Pulse: (!) 54 86 80 61  Resp: Temp:    98.3 F (36.8 C)  TempSrc:    Oral  SpO2: 97% 97% 99% 99%  Weight:    84.7 kg  Height:     (1.676 m)    Constitutional: NAD, calm, comfortable Vitals:   11/01/20 0900 11/01/20 1236 11/01/20 1300 11/01/20 1517  BP: 103/74 128/89 139/78 (!) 142/86  Pulse: (!) 54 86 80 61  Resp: Temp:    98.3 F (36.8 C)  TempSrc:    Oral  SpO2: 97% 97% 99% 99%  Weight:    84.7 kg  Height:     (1.676 m)   Eyes: PERRL, lids and conjunctivae normal ENMT: Mucous membranes are moist. Posterior pharynx clear of any exudate or lesions.Normal dentition.  Neck: normal, supple, no masses, no thyromegaly Respiratory: clear to auscultation bilaterally, no wheezing, no crackles. Normal respiratory effort. No accessory muscle use.  Cardiovascular: Regular rate and rhythm, no murmurs / rubs / gallops. No extremity edema. 2+ pedal pulses. No carotid bruits.  Abdomen: no tenderness, no masses palpated. No hepatosplenomegaly. Bowel sounds positive.  Musculoskeletal: no clubbing / cyanosis. No joint deformity upper and lower extremities. Good ROM, no contractures. Normal muscle tone.  Skin: no rashes, lesions, ulcers. No induration Neurologic: CN 2-12 grossly intact. Sensation intact, DTR normal. Strength 5/5 in all 4.   Psychiatric: Normal judgment and insight. Alert and oriented x 3. Normal mood.     Labs on Admission: I have personally reviewed following labs and imaging studies  CBC: Recent Labs  Lab 10/31/20 2019  WBC 5.7  NEUTROABS 3.0  HGB 16.3  HCT 47.9  MCV 86.0  PLT 196   Basic Metabolic Panel: Recent Labs  Lab 10/31/20 2019  NA 140  K 3.7  CL 101  CO2 28  GLUCOSE 100*  BUN 15  CREATININE 0.82  CALCIUM 9.6   GFR: Estimated Creatinine Clearance: 127.2 mL/min (by C-G formula based on SCr of 0.82 mg/dL). Liver Function Tests: Recent Labs  Lab 10/31/20 2019  AST 21  ALT 21  ALKPHOS 66  BILITOT 0.5  PROT 7.0  ALBUMIN 4.4   Recent Labs  Lab 10/31/20 2019  LIPASE 22   No results for input(s): AMMONIA in the last 168 hours. Coagulation Profile: No results for input(s): INR, PROTIME  in the last 168 hours. Cardiac Enzymes: No results for input(s): CKTOTAL, CKMB, CKMBINDEX, TROPONINI in the last 168 hours. BNP (last 3 results) No results for input(s): PROBNP in the last 8760 hours. HbA1C: No results for input(s): HGBA1C in the last 72 hours. CBG: No results for input(s): GLUCAP in the last 168 hours. Lipid Profile: No results for input(s): CHOL, HDL, LDLCALC, TRIG, CHOLHDL, LDLDIRECT in the last 72 hours. Thyroid Function Tests: Recent Labs    10/31/20 2200  TSH 3.295   Anemia Panel: No results for input(s): VITAMINB12, FOLATE, FERRITIN, TIBC, IRON, RETICCTPCT in the last 72 hours. Urine analysis:    Component Value Date/Time   COLORURINE YELLOW 10/31/2020 2025   APPEARANCEUR CLEAR 10/31/2020 2025   LABSPEC 1.029 10/31/2020 2025   PHURINE 6.0 10/31/2020 2025   GLUCOSEU NEGATIVE 10/31/2020 2025   GLUCOSEU NEGATIVE 06/26/2017 1512   HGBUR NEGATIVE 10/31/2020 2025   BILIRUBINUR NEGATIVE 10/31/2020 2025   KETONESUR NEGATIVE 10/31/2020 2025   PROTEINUR TRACE (A) 10/31/2020 2025   UROBILINOGEN 0.2 06/26/2017 1512   NITRITE NEGATIVE 10/31/2020 2025    LEUKOCYTESUR NEGATIVE 10/31/2020 2025    Radiological Exams on Admission: CT Angio Head W or Wo Contrast  Result Date: 10/31/2020 CLINICAL DATA:  Initial evaluation for atypical headache, facial swelling and pressure. EXAM: CT ANGIOGRAPHY HEAD AND NECK TECHNIQUE: Multidetector CT imaging of the head and neck was performed using the standard protocol during bolus administration of intravenous contrast. Multiplanar CT image reconstructions and MIPs were obtained to evaluate the vascular anatomy. Carotid stenosis measurements (when applicable) are obtained utilizing NASCET criteria, using the distal internal carotid diameter as the denominator. CONTRAST:  75mL OMNIPAQUE IOHEXOL 350 MG/ML SOLN COMPARISON:  None. FINDINGS: CT HEAD FINDINGS Brain: Cerebral volume within normal limits for patient age. No evidence for acute intracranial hemorrhage. No findings to suggest acute large vessel territory infarct. No mass lesion, midline shift, or mass effect. Ventricles are normal in size without evidence for hydrocephalus. No extra-axial fluid collection identified. 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 Vascular: No hyperdense vessel identified. Skull: Scalp soft tissues demonstrate no acute abnormality. Calvarium intact. Sinuses/Orbits: Globes and orbital soft tissues within normal limits. Visualized paranasal sinuses are clear. No mastoid effusion. CTA NECK FINDINGS Aortic arch: Visualized aortic arch normal in caliber with normal branch pattern. No stenosis or other abnormality seen about the origin the great vessels. Right carotid system: Right common and internal carotid arteries widely patent without stenosis, dissection or occlusion. Left carotid system: Left common and internal carotid arteries widely patent without stenosis, dissection or occlusion. Vertebral arteries: Both vertebral arteries arise from the subclavian  arteries. No proximal subclavian artery stenosis. Dominant left vertebral artery with diffusely hypoplastic right vertebral artery noted. Both vertebral arteries widely patent without stenosis, dissection or occlusion. Skeleton: No visible acute osseous finding. No discrete or worrisome osseous lesions. Other neck: No other acute soft tissue abnormality within the neck. No mass or adenopathy. Upper chest: Visualized upper chest demonstrates no acute finding. Review of the MIP images confirms the above findings CTA HEAD FINDINGS Anterior circulation: Both internal carotid arteries widely patent to the termini without stenosis. A1 segments widely patent. Normal anterior communicating artery complex. Both anterior cerebral arteries widely patent to their distal aspects without stenosis. No M1 stenosis or occlusion. Normal MCA bifurcations. Distal MCA branches well perfused and symmetric. Posterior circulation: Dominant left vertebral artery widely patent to the vertebrobasilar junction. Hypoplastic right vertebral  artery terminates in PICA. Both PICA patent. Basilar patent to its distal aspect without stenosis. Superior cerebral arteries patent bilaterally. Both PCAs primarily supplied via the basilar well perfused to their distal aspects. Venous sinuses: Grossly patent allowing for timing the contrast bolus. Anatomic variants: Hypoplastic right vertebral artery terminates in PICA. No aneurysm or other vascular abnormality. Review of the MIP images confirms the above findings IMPRESSION: 1. Normal CTA of the head and neck. No large vessel occlusion, hemodynamically significant stenosis, dissection, or other acute vascular abnormality. 2. 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 3. Otherwise normal head CT. No other acute intracranial abnormality. Electronically Signed   By: Rise Mu M.D.   On: 10/31/2020 23:22   X-ray chest PA and lateral  Result Date: 11/01/2020 CLINICAL DATA:   Headache, dizziness and near-syncope. Orthostatic hypertension. EXAM: CHEST - 2 VIEW COMPARISON:  Single-view of the chest 08/14/2020. FINDINGS: Lungs clear.  Heart size normal.  No pneumothorax or pleural fluid. IMPRESSION: Negative chest. Electronically Signed   By: Drusilla Kanner M.D.   On: 11/01/2020 16:52   CT Angio Neck W and/or Wo Contrast  Result Date: 10/31/2020 CLINICAL DATA:  Initial evaluation for atypical headache, facial swelling and pressure. EXAM: CT ANGIOGRAPHY HEAD AND NECK TECHNIQUE: Multidetector CT imaging of the head and neck was performed using the standard protocol during bolus administration of intravenous contrast. Multiplanar CT image reconstructions and MIPs were obtained to evaluate the vascular anatomy. Carotid stenosis measurements (when applicable) are obtained utilizing NASCET criteria, using the distal internal carotid diameter as the denominator. CONTRAST:  75mL OMNIPAQUE IOHEXOL 350 MG/ML SOLN COMPARISON:  None. FINDINGS: CT HEAD FINDINGS Brain: Cerebral volume within normal limits for patient age. No evidence for acute intracranial hemorrhage. No findings to suggest acute large vessel territory infarct. No mass lesion, midline shift, or mass effect. Ventricles are normal in size without evidence for hydrocephalus. No extra-axial fluid collection identified. 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 Vascular: No hyperdense vessel identified. Skull: Scalp soft tissues demonstrate no acute abnormality. Calvarium intact. Sinuses/Orbits: Globes and orbital soft tissues within normal limits. Visualized paranasal sinuses are clear. No mastoid effusion. CTA NECK FINDINGS Aortic arch: Visualized aortic arch normal in caliber with normal branch pattern. No stenosis or other abnormality seen about the origin the great vessels. Right carotid system: Right common and internal carotid arteries widely  patent without stenosis, dissection or occlusion. Left carotid system: Left common and internal carotid arteries widely patent without stenosis, dissection or occlusion. Vertebral arteries: Both vertebral arteries arise from the subclavian arteries. No proximal subclavian artery stenosis. Dominant left vertebral artery with diffusely hypoplastic right vertebral artery noted. Both vertebral arteries widely patent without stenosis, dissection or occlusion. Skeleton: No visible acute osseous finding. No discrete or worrisome osseous lesions. Other neck: No other acute soft tissue abnormality within the neck. No mass or adenopathy. Upper chest: Visualized upper chest demonstrates no acute finding. Review of the MIP images confirms the above findings CTA HEAD FINDINGS Anterior circulation: Both internal carotid arteries widely patent to the termini without stenosis. A1 segments widely patent. Normal anterior communicating artery complex. Both anterior cerebral arteries widely patent to their distal aspects without stenosis. No M1 stenosis or occlusion. Normal MCA bifurcations. Distal MCA branches well perfused and symmetric. Posterior circulation: Dominant left vertebral artery widely patent to the vertebrobasilar junction. Hypoplastic right vertebral artery terminates in PICA. Both PICA patent. Basilar patent to its distal aspect  without stenosis. Superior cerebral arteries patent bilaterally. Both PCAs primarily supplied via the basilar well perfused to their distal aspects. Venous sinuses: Grossly patent allowing for timing the contrast bolus. Anatomic variants: Hypoplastic right vertebral artery terminates in PICA. No aneurysm or other vascular abnormality. Review of the MIP images confirms the above findings IMPRESSION: 1. Normal CTA of the head and neck. No large vessel occlusion, hemodynamically significant stenosis, dissection, or other acute vascular abnormality. 2. Chiari 1 malformation, which could contribute  to headaches. 3. Otherwise normal head CT. No other acute intracranial abnormality. Electronically Signed   By: Rise Mu M.D.   On: 10/31/2020 23:22   CT Chest W Contrast  Result Date: 10/31/2020 CLINICAL DATA:  Chest pain, cardiac cause suspected (Ped 0-18y). Concern for possible SVC syndrome EXAM: CT CHEST WITH CONTRAST TECHNIQUE: Multidetector CT imaging of the chest was performed during intravenous contrast administration. CONTRAST:  53mL OMNIPAQUE IOHEXOL 350 MG/ML SOLN COMPARISON:  None. FINDINGS: Cardiovascular: Normal heart size. No significant pericardial effusion. The thoracic aorta is normal in caliber. No atherosclerotic plaque of the thoracic aorta. No coronary artery calcifications. The main pulmonary artery is normal in caliber. No central pulmonary embolus. Mediastinum/Nodes: No enlarged mediastinal, hilar, or axillary lymph nodes. Subcentimeter hypodensity within the right thyroid gland. Otherwise thyroid gland, trachea, and esophagus demonstrate no significant findings. Lungs/Pleura: Bilateral lower lobe subsegmental atelectasis. No focal consolidation. No pulmonary nodule. No pulmonary mass. No pleural effusion. No pneumothorax. Upper Abdomen: No acute abnormality. Musculoskeletal: No chest wall abnormality. No suspicious lytic or blastic osseous lesions. No acute displaced fracture. Multilevel degenerative changes of the spine. IMPRESSION: No acute intrathoracic abnormality. Electronically Signed   By: Tish Frederickson M.D.   On: 10/31/2020 22:52   MR BRAIN WO CONTRAST  Result Date: 11/01/2020 CLINICAL DATA:  Chiari 1 malformation. Additional history provided: Headache, dizziness, near syncope, orthostatic hypertension. EXAM: MRI HEAD WITHOUT CONTRAST TECHNIQUE: Multiplanar, multiecho pulse sequences of the brain and surrounding structures were obtained without intravenous contrast. COMPARISON:  CT angiogram head/neck 10/31/2020. FINDINGS: Brain: Cerebral volume is normal.  Redemonstrated Chiari 1 malformation. The cerebellar tonsils extend 9 mm below the foramen magnum. Associated crowding at the level of the foramen magnum. No cortical encephalomalacia is identified. No significant cerebral white matter disease. There is no acute infarct. No evidence of an intracranial mass. No chronic intracranial blood products. No extra-axial fluid collection. No midline shift. Vascular: Expected proximal arterial flow voids. Skull and upper cervical spine: No focal marrow lesion. Sinuses/Orbits: Visualized orbits show no acute finding. Bilateral maxillary sinus mucous retention cysts, the largest on the right measuring 2 cm. IMPRESSION: Chiari 1 malformation, as described. Otherwise unremarkable non-contrast MRI appearance of the brain. Bilateral maxillary sinus mucous retention cysts. Electronically Signed   By: Jackey Loge DO   On: 11/01/2020 16:39    EKG: Independently reviewed. Sinus bradycardia.  Assessment/Plan Active Problems:   Near syncope  (please populate well all problems here in Problem List. (For example, if patient is on BP meds at home and you resume or decide to hold them, it is a problem that needs to be her. Same for CAD, COPD, HLD and so on)  Near syncope -Secondary to uncontrolled hypertension -Given there is a significant bradycardia, discontinue metoprolol.  Discussed with patient guarding HTN options, patient agreed with Amlodipine. -Echo   HTN uncontrolled -Start amlodipine. -Check UDS.  Headache -Doubt this is related to Chiari malformation, symptoms more compatible with sinusitis.  Continue saline spray.  As needed Fioricet.  Anxiety -Patient declined Benzos, probably worries about picking addition? Then I propose SSRI, but patient would like to talk to his PCP to decide.  Brain Chiari malformation -MRI showed no significant impact on the cerebellar tonsils. Probably can follow up with neurosurgery.  Suboxone therapy -Ordered.   DVT  prophylaxis: SCD Code Status: Full Code Family Communication: None at bedside Disposition Plan: less than 2 midnight hospital stay Consults called: none Admission status: Tele obs   Emeline General MD [facility] Pager [phone]  11/01/2020, 6:21 PM

## 2020-11-02 ENCOUNTER — Observation Stay (HOSPITAL_BASED_OUTPATIENT_CLINIC_OR_DEPARTMENT_OTHER): Payer: Managed Care, Other (non HMO)

## 2020-11-02 ENCOUNTER — Encounter (HOSPITAL_COMMUNITY): Payer: Self-pay | Admitting: Internal Medicine

## 2020-11-02 DIAGNOSIS — R55 Syncope and collapse: Secondary | ICD-10-CM | POA: Diagnosis not present

## 2020-11-02 DIAGNOSIS — R519 Headache, unspecified: Secondary | ICD-10-CM | POA: Diagnosis not present

## 2020-11-02 LAB — ECHOCARDIOGRAM COMPLETE
Area-P 1/2: 3.91 cm2
Calc EF: 63.9 %
Height: 66 in
S' Lateral: 2.8 cm
Single Plane A2C EF: 62.8 %
Single Plane A4C EF: 64.3 %
Weight: 2987.67 oz

## 2020-11-02 LAB — GLUCOSE, CAPILLARY: Glucose-Capillary: 90 mg/dL (ref 70–99)

## 2020-11-02 LAB — HIV ANTIBODY (ROUTINE TESTING W REFLEX): HIV Screen 4th Generation wRfx: NONREACTIVE

## 2020-11-02 MED ORDER — METOPROLOL TARTRATE 25 MG PO TABS
25.0000 mg | ORAL_TABLET | Freq: Two times a day (BID) | ORAL | Status: DC
  Administered 2020-11-02: 25 mg via ORAL
  Filled 2020-11-02: qty 1

## 2020-11-02 MED ORDER — METOPROLOL TARTRATE 25 MG PO TABS: 25.0000 mg | ORAL_TABLET | Freq: Two times a day (BID) | ORAL | 0 refills | Status: AC | PRN

## 2020-11-02 MED ORDER — LACTATED RINGERS IV SOLN: INTRAVENOUS | Status: DC

## 2020-11-02 NOTE — Progress Notes (Signed)
Patient assisted with gathering personal items. IV removed. Patient educated on discharge instructions and follow up appointments. No complaints or concerns stated.

## 2020-11-02 NOTE — Plan of Care (Signed)

## 2020-11-02 NOTE — Discharge Instructions (Signed)
Follow with Primary MD Chiquita Loth, PA in 7 days   Get BP, CBC, CMP checked next visit within 1 week by Primary MD    Activity: As tolerated with Full fall precautions use walker/cane & assistance as needed  Disposition Home    Diet: Heart Healthy    Special Instructions: If you have smoked or chewed Tobacco  in the last 2 yrs please stop smoking, stop any regular Alcohol  and or any Recreational drug use.  On your next visit with your primary care physician please Get Medicines reviewed and adjusted.  Please request your Prim.MD to go over all Hospital Tests and Procedure/Radiological results at the follow up, please get all Hospital records sent to your Prim MD by signing hospital release before you go home.  If you experience worsening of your admission symptoms, develop shortness of breath, life threatening emergency, suicidal or homicidal thoughts you must seek medical attention immediately by calling 911 or calling your MD immediately  if symptoms less severe.  You Must read complete instructions/literature along with all the possible adverse reactions/side effects for all the Medicines you take and that have been prescribed to you. Take any new Medicines after you have completely understood and accpet all the possible adverse reactions/side effects.

## 2020-11-02 NOTE — Progress Notes (Signed)
PT Cancellation Note  Patient Details Name: Hector Thomas MRN: [id number] DOB: 1985-07-31   Cancelled Treatment:    Reason Eval/Treat Not Completed: PT screened, no needs identified, will sign off actually encountered patient walking in the hallway independently. Discussed mobility, he reports no concerns at this time. PT signing off as he has no skilled needs. Thank you for the opportunity to participate in his care!   Madelaine Etienne, DPT, PN1   Supplemental Physical Therapist [facility]    Pager [phone] Acute Rehab Office [phone]

## 2020-11-02 NOTE — Progress Notes (Signed)
  Echocardiogram 2D Echocardiogram has been performed.  [name] 11/02/2020, 8:52 AM

## 2020-11-02 NOTE — Discharge Summary (Addendum)
Hector Thomas:[id number] DOB: 02-22-1986 DOA: 10/31/2020  PCP: Chiquita Loth, PA  Admit date: 10/31/2020  Discharge date: 11/02/2020  Admitted From: Home  Disposition:  Home   Recommendations for Outpatient Follow-up:   Follow up with PCP in 1-2 weeks  PCP Please obtain BMP/CBC, 2 view CXR in 1week,  (see Discharge instructions)   PCP Please follow up on the following pending results: Monitor blood pressure closely may need outpatient cardiology follow-up, one-time outpatient neurosurgery follow-up for incidental Arnold-Chiari malformation noted on MRI.   Home Health: None Equipment/Devices: None  Consultations: None  Discharge Condition: Stable    CODE STATUS: Full    Diet Recommendation: Heart Healthy   Diet Order             Diet - low sodium heart healthy           Diet regular Room service appropriate? Yes; Fluid consistency: Thin  Diet effective now                    Chief Complaint  Patient presents with   Elevated BP     Brief history of present illness from the day of admission and additional interim summary    Hector Thomas is a 35 y.o. male with medical history significant of recently diagnosed HTN, history of narcotic abuse now on Suboxone, anxiety disorder, presented with headache, dizzy and uncontrolled hypertension.  Patient has had uncontrolled anxiety, went to see PCP 2 months ago was found blood pressure significantly elevated.  PCP  was concerned about starting Benzos may have some unwanted effect (addiction?) since patient already on Suboxone, and with the significant elevation of blood pressure, PCP decided to start him on metoprolol 50 mg twice daily PRN, in the hospital actually his supine blood pressures were initially low after hydration with IV fluids blood  pressures have improved.                                                                   Hospital Course   Weakness and mild headaches.  Now resolved.  TSH, echocardiogram, MRI brain nonacute.  Does have incidental maxillary sinus retention cyst and type I Arnold-Chiari malformation.  Case was discussed with neurosurgery over the phone who wanted outpatient follow-up neurosurgery.  2.  History of narcotic abuse in the past.  Now on Suboxone being tapered.  3.  HX of anxiety and panic attacks.  Defer management to PCP no acute issues.  4.  A.m. hypertension with nighttime hypotension according to the patient.  Blood pressure seems to be slightly labile mild anxiety could be contributing to it.  I will place him on as needed 25 Lopressor twice daily instead of Toprol-XL once a day as he showed nighttime drop in blood pressures when  he was sleeping with systolic in 90s.  TSH and echocardiogram are stable, he does have slight anxiety and this along with his Suboxone taper could be contributing to his a.m. hypertension.  Request PCP to monitor his blood pressure closely if blood pressure continues to be an issue outpatient cardiology follow-up can be considered.   Discharge diagnosis     Active Problems:   Near syncope   Nonintractable headache    Discharge instructions    Discharge Instructions     Diet - low sodium heart healthy   Complete by: As directed    Discharge instructions   Complete by: As directed    Follow with Primary MD Chiquita Loth, PA in 7 days   Get BP, CBC, CMP checked next visit within 1 week by Primary MD    Activity: As tolerated with Full fall precautions use walker/cane & assistance as needed  Disposition Home    Diet: Heart Healthy    Special Instructions: If you have smoked or chewed Tobacco  in the last 2 yrs please stop smoking, stop any regular Alcohol  and or any Recreational drug use.  On your next visit with your primary care physician  please Get Medicines reviewed and adjusted.  Please request your Prim.MD to go over all Hospital Tests and Procedure/Radiological results at the follow up, please get all Hospital records sent to your Prim MD by signing hospital release before you go home.  If you experience worsening of your admission symptoms, develop shortness of breath, life threatening emergency, suicidal or homicidal thoughts you must seek medical attention immediately by calling 911 or calling your MD immediately  if symptoms less severe.  You Must read complete instructions/literature along with all the possible adverse reactions/side effects for all the Medicines you take and that have been prescribed to you. Take any new Medicines after you have completely understood and accpet all the possible adverse reactions/side effects.   Increase activity slowly   Complete by: As directed        Discharge Medications   Allergies as of 11/02/2020   No Known Allergies      Medication List     STOP taking these medications    metoprolol succinate 50 MG 24 hr tablet Commonly known as: TOPROL-XL       TAKE these medications    Buprenorphine HCl-Naloxone HCl 8-2 MG Film Place 4 Film under the tongue 2 (two) times daily.   cholecalciferol 25 MCG (1000 UNIT) tablet Commonly known as: VITAMIN D3 Take 1,000 Units by mouth daily.   famotidine 40 MG tablet Commonly known as: PEPCID Take 40 mg by mouth daily as needed for heartburn or indigestion.   L-CITRULLINE PO Take 2 capsules by mouth in the morning.   magnesium 30 MG tablet Take 30 mg by mouth daily.   metoprolol tartrate 25 MG tablet Commonly known as: LOPRESSOR Take 1 tablet (25 mg total) by mouth 2 (two) times daily as needed (BP > 140, H.Rate > 95).   testosterone cypionate 200 MG/ML injection Commonly known as: DEPOTESTOSTERONE CYPIONATE Inject 100 mg into the muscle every 7 (seven) days.   vitamin C 100 MG tablet Take 100 mg by mouth daily.    zinc gluconate 50 MG tablet Take 50 mg by mouth daily.         Follow-up Information     Chiquita Loth, [place]. Schedule an appointment as soon as possible for a visit in 1 week(s).   Specialty: Physician Assistant Contact  information: [address] [place] [address] [phone]         Jadene Pierini, MD. Schedule an appointment as soon as possible for a visit in 2 week(s).   Specialty: Neurosurgery Contact information: [address] [place] [address] [phone]                 Major procedures and Radiology Reports - PLEASE review detailed and final reports thoroughly  -        CT Angio Head W or Wo Contrast  Result Date: 10/31/2020 CLINICAL DATA:  Initial evaluation for atypical headache, facial swelling and pressure. EXAM: CT ANGIOGRAPHY HEAD AND NECK TECHNIQUE: Multidetector CT imaging of the head and neck was performed using the standard protocol during bolus administration of intravenous contrast. Multiplanar CT image reconstructions and MIPs were obtained to evaluate the vascular anatomy. Carotid stenosis measurements (when applicable) are obtained utilizing NASCET criteria, using the distal internal carotid diameter as the denominator. CONTRAST:  62mL OMNIPAQUE IOHEXOL 350 MG/ML SOLN COMPARISON:  None. FINDINGS: CT HEAD FINDINGS Brain: Cerebral volume within normal limits for patient age. No evidence for acute intracranial hemorrhage. No findings to suggest acute large vessel territory infarct. No mass lesion, midline shift, or mass effect. Ventricles are normal in size without evidence for hydrocephalus. No extra-axial fluid collection identified. 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 Vascular: No hyperdense vessel identified. Skull: Scalp soft tissues demonstrate no acute abnormality. Calvarium intact. Sinuses/Orbits: Globes and orbital soft tissues  within normal limits. Visualized paranasal sinuses are clear. No mastoid effusion. CTA NECK FINDINGS Aortic arch: Visualized aortic arch normal in caliber with normal branch pattern. No stenosis or other abnormality seen about the origin the great vessels. Right carotid system: Right common and internal carotid arteries widely patent without stenosis, dissection or occlusion. Left carotid system: Left common and internal carotid arteries widely patent without stenosis, dissection or occlusion. Vertebral arteries: Both vertebral arteries arise from the subclavian arteries. No proximal subclavian artery stenosis. Dominant left vertebral artery with diffusely hypoplastic right vertebral artery noted. Both vertebral arteries widely patent without stenosis, dissection or occlusion. Skeleton: No visible acute osseous finding. No discrete or worrisome osseous lesions. Other neck: No other acute soft tissue abnormality within the neck. No mass or adenopathy. Upper chest: Visualized upper chest demonstrates no acute finding. Review of the MIP images confirms the above findings CTA HEAD FINDINGS Anterior circulation: Both internal carotid arteries widely patent to the termini without stenosis. A1 segments widely patent. Normal anterior communicating artery complex. Both anterior cerebral arteries widely patent to their distal aspects without stenosis. No M1 stenosis or occlusion. Normal MCA bifurcations. Distal MCA branches well perfused and symmetric. Posterior circulation: Dominant left vertebral artery widely patent to the vertebrobasilar junction. Hypoplastic right vertebral artery terminates in PICA. Both PICA patent. Basilar patent to its distal aspect without stenosis. Superior cerebral arteries patent bilaterally. Both PCAs primarily supplied via the basilar well perfused to their distal aspects. Venous sinuses: Grossly patent allowing for timing the contrast bolus. Anatomic variants: Hypoplastic right vertebral  artery terminates in PICA. No aneurysm or other vascular abnormality. Review of the MIP images confirms the above findings IMPRESSION: 1. Normal CTA of the head and neck. No large vessel occlusion, hemodynamically significant stenosis, dissection, or other acute vascular abnormality. 2. 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 3. Otherwise normal head CT. No other acute intracranial abnormality. Electronically Signed   By:  Rise Mu M.D.   On: 10/31/2020 23:22   X-ray chest PA and lateral  Result Date: 11/01/2020 CLINICAL DATA:  Headache, dizziness and near-syncope. Orthostatic hypertension. EXAM: CHEST - 2 VIEW COMPARISON:  Single-view of the chest 08/14/2020. FINDINGS: Lungs clear.  Heart size normal.  No pneumothorax or pleural fluid. IMPRESSION: Negative chest. Electronically Signed   By: Drusilla Kanner M.D.   On: 11/01/2020 16:52   CT Angio Neck W and/or Wo Contrast  Result Date: 10/31/2020 CLINICAL DATA:  Initial evaluation for atypical headache, facial swelling and pressure. EXAM: CT ANGIOGRAPHY HEAD AND NECK TECHNIQUE: Multidetector CT imaging of the head and neck was performed using the standard protocol during bolus administration of intravenous contrast. Multiplanar CT image reconstructions and MIPs were obtained to evaluate the vascular anatomy. Carotid stenosis measurements (when applicable) are obtained utilizing NASCET criteria, using the distal internal carotid diameter as the denominator. CONTRAST:  75mL OMNIPAQUE IOHEXOL 350 MG/ML SOLN COMPARISON:  None. FINDINGS: CT HEAD FINDINGS Brain: Cerebral volume within normal limits for patient age. No evidence for acute intracranial hemorrhage. No findings to suggest acute large vessel territory infarct. No mass lesion, midline shift, or mass effect. Ventricles are normal in size without evidence for hydrocephalus. No extra-axial fluid collection identified. Chiari 1 malformation with the cerebellar tonsils extending  through the foramen magnum, incompletely visualized on this exam. Vascular: No hyperdense vessel identified. Skull: Scalp soft tissues demonstrate no acute abnormality. Calvarium intact. Sinuses/Orbits: Globes and orbital soft tissues within normal limits. Visualized paranasal sinuses are clear. No mastoid effusion. CTA NECK FINDINGS Aortic arch: Visualized aortic arch normal in caliber with normal branch pattern. No stenosis or other abnormality seen about the origin the great vessels. Right carotid system: Right common and internal carotid arteries widely patent without stenosis, dissection or occlusion. Left carotid system: Left common and internal carotid arteries widely patent without stenosis, dissection or occlusion. Vertebral arteries: Both vertebral arteries arise from the subclavian arteries. No proximal subclavian artery stenosis. Dominant left vertebral artery with diffusely hypoplastic right vertebral artery noted. Both vertebral arteries widely patent without stenosis, dissection or occlusion. Skeleton: No visible acute osseous finding. No discrete or worrisome osseous lesions. Other neck: No other acute soft tissue abnormality within the neck. No mass or adenopathy. Upper chest: Visualized upper chest demonstrates no acute finding. Review of the MIP images confirms the above findings CTA HEAD FINDINGS Anterior circulation: Both internal carotid arteries widely patent to the termini without stenosis. A1 segments widely patent. Normal anterior communicating artery complex. Both anterior cerebral arteries widely patent to their distal aspects without stenosis. No M1 stenosis or occlusion. Normal MCA bifurcations. Distal MCA branches well perfused and symmetric. Posterior circulation: Dominant left vertebral artery widely patent to the vertebrobasilar junction. Hypoplastic right vertebral artery terminates in PICA. Both PICA patent. Basilar patent to its distal aspect without stenosis. Superior cerebral  arteries patent bilaterally. Both PCAs primarily supplied via the basilar well perfused to their distal aspects. Venous sinuses: Grossly patent allowing for timing the contrast bolus. Anatomic variants: Hypoplastic right vertebral artery terminates in PICA. No aneurysm or other vascular abnormality. Review of the MIP images confirms the above findings IMPRESSION: 1. Normal CTA of the head and neck. No large vessel occlusion, hemodynamically significant stenosis, dissection, or other acute vascular abnormality. 2. 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 3. Otherwise normal head CT. No other acute intracranial abnormality. Electronically Signed   By: Rise Mu M.D.   On: 10/31/2020 23:22   CT Chest  W Contrast  Result Date: 10/31/2020 CLINICAL DATA:  Chest pain, cardiac cause suspected (Ped 0-18y). Concern for possible SVC syndrome EXAM: CT CHEST WITH CONTRAST TECHNIQUE: Multidetector CT imaging of the chest was performed during intravenous contrast administration. CONTRAST:  75mL OMNIPAQUE IOHEXOL 350 MG/ML SOLN COMPARISON:  None. FINDINGS: Cardiovascular: Normal heart size. No significant pericardial effusion. The thoracic aorta is normal in caliber. No atherosclerotic plaque of the thoracic aorta. No coronary artery calcifications. The main pulmonary artery is normal in caliber. No central pulmonary embolus. Mediastinum/Nodes: No enlarged mediastinal, hilar, or axillary lymph nodes. Subcentimeter hypodensity within the right thyroid gland. Otherwise thyroid gland, trachea, and esophagus demonstrate no significant findings. Lungs/Pleura: Bilateral lower lobe subsegmental atelectasis. No focal consolidation. No pulmonary nodule. No pulmonary mass. No pleural effusion. No pneumothorax. Upper Abdomen: No acute abnormality. Musculoskeletal: No chest wall abnormality. No suspicious lytic or blastic osseous lesions. No acute displaced fracture. Multilevel degenerative changes of the  spine. IMPRESSION: No acute intrathoracic abnormality. Electronically Signed   By: Tish FredericksonMorgane  Naveau M.D.   On: 10/31/2020 22:52   MR BRAIN WO CONTRAST  Result Date: 11/01/2020 CLINICAL DATA:  Chiari 1 malformation. Additional history provided: Headache, dizziness, near syncope, orthostatic hypertension. EXAM: MRI HEAD WITHOUT CONTRAST TECHNIQUE: Multiplanar, multiecho pulse sequences of the brain and surrounding structures were obtained without intravenous contrast. COMPARISON:  CT angiogram head/neck 10/31/2020. FINDINGS: Brain: Cerebral volume is normal. Redemonstrated Chiari 1 malformation. The cerebellar tonsils extend 9 mm below the foramen magnum. Associated crowding at the level of the foramen magnum. No cortical encephalomalacia is identified. No significant cerebral white matter disease. There is no acute infarct. No evidence of an intracranial mass. No chronic intracranial blood products. No extra-axial fluid collection. No midline shift. Vascular: Expected proximal arterial flow voids. Skull and upper cervical spine: No focal marrow lesion. Sinuses/Orbits: Visualized orbits show no acute finding. Bilateral maxillary sinus mucous retention cysts, the largest on the right measuring 2 cm. IMPRESSION: Chiari 1 malformation, as described. Otherwise unremarkable non-contrast MRI appearance of the brain. Bilateral maxillary sinus mucous retention cysts. Electronically Signed   By: Jackey LogeKyle  Golden DO   On: 11/01/2020 16:39   ECHOCARDIOGRAM COMPLETE  Result Date: 11/02/2020    ECHOCARDIOGRAM REPORT   Patient Name:   Hector PatienceMARK H Lobato Date of Exam: 11/02/2020 Medical Rec #:  [id number]     Height:       66.0 in Accession #:    [id number]    Weight:       186.7 lb Date of Birth:  09-Nov-1985    BSA:          1.942 m Patient Age:    34 years      BP:           103/69 mmHg Patient Gender: M             HR:           60 bpm. Exam Location:  Inpatient Procedure: 2D Echo, 3D Echo, Cardiac Doppler and Color Doppler  Indications:    R55 Syncope  History:        Patient has no prior history of Echocardiogram examinations.                 Signs/Symptoms:Dizziness/Lightheadedness; Risk                 Factors:Hypertension. Substance abuse.  Sonographer:    Sheralyn Boatmanina West RDCS Referring Phys: [id number] Emeline GeneralPING T ZHANG  Sonographer Comments: Patient denies syncope, says his complaint  was dizziness. IMPRESSIONS  1. Left ventricular ejection fraction, by estimation, is 60 to 65%. The left ventricle has normal function. The left ventricle has no regional wall motion abnormalities. Left ventricular diastolic parameters were normal.  2. Right ventricular systolic function is normal. The right ventricular size is normal. Tricuspid regurgitation signal is inadequate for assessing PA pressure.  3. The mitral valve is normal in structure. Trivial mitral valve regurgitation.  4. The aortic valve was not well visualized. Aortic valve regurgitation is not visualized. No aortic stenosis is present.  5. The inferior vena cava is dilated in size with >50% respiratory variability, suggesting right atrial pressure of 8 mmHg. FINDINGS  Left Ventricle: Left ventricular ejection fraction, by estimation, is 60 to 65%. The left ventricle has normal function. The left ventricle has no regional wall motion abnormalities. The left ventricular internal cavity size was normal in size. There is  no left ventricular hypertrophy. Left ventricular diastolic parameters were normal. Right Ventricle: The right ventricular size is normal. No increase in right ventricular wall thickness. Right ventricular systolic function is normal. Tricuspid regurgitation signal is inadequate for assessing PA pressure. Left Atrium: Left atrial size was normal in size. Right Atrium: Right atrial size was normal in size. Pericardium: There is no evidence of pericardial effusion. Mitral Valve: The mitral valve is normal in structure. Trivial mitral valve regurgitation. Tricuspid Valve: The  tricuspid valve is normal in structure. Tricuspid valve regurgitation is trivial. Aortic Valve: The aortic valve was not well visualized. Aortic valve regurgitation is not visualized. No aortic stenosis is present. Pulmonic Valve: The pulmonic valve was not well visualized. Pulmonic valve regurgitation is not visualized. Aorta: The aortic root and ascending aorta are structurally normal, with no evidence of dilitation. Venous: The inferior vena cava is dilated in size with greater than 50% respiratory variability, suggesting right atrial pressure of 8 mmHg. IAS/Shunts: The interatrial septum was not well visualized.  LEFT VENTRICLE PLAX 2D LVIDd:         4.60 cm     Diastology LVIDs:         2.80 cm     LV e' medial:    9.90 cm/s LV PW:         1.10 cm     LV E/e' medial:  6.5 LV IVS:        1.00 cm     LV e' lateral:   16.00 cm/s LVOT diam:     2.10 cm     LV E/e' lateral: 4.0 LV SV:         65 LV SV Index:   34 LVOT Area:     3.46 cm  LV Volumes (MOD) LV vol d, MOD A2C: 72.0 ml LV vol d, MOD A4C: 84.8 ml LV vol s, MOD A2C: 26.8 ml LV vol s, MOD A4C: 30.3 ml LV SV MOD A2C:     45.2 ml LV SV MOD A4C:     84.8 ml LV SV MOD BP:      51.3 ml RIGHT VENTRICLE             IVC RV S prime:     16.50 cm/s  IVC diam: 2.10 cm TAPSE (M-mode): 1.8 cm LEFT ATRIUM             Index       RIGHT ATRIUM           Index LA diam:        3.40 cm 1.75 cm/m  RA Area:     13.40 cm LA Vol (A2C):   46.1 ml 23.73 ml/m RA Volume:   28.40 ml  14.62 ml/m LA Vol (A4C):   23.1 ml 11.89 ml/m LA Biplane Vol: 33.8 ml 17.40 ml/m  AORTIC VALVE LVOT Vmax:   99.50 cm/s LVOT Vmean:  65.100 cm/s LVOT VTI:    0.189 m  AORTA Ao Root diam: 3.80 cm Ao Asc diam:  3.00 cm MITRAL VALVE MV Area (PHT): 3.91 cm    SHUNTS MV Decel Time: 194 msec    Systemic VTI:  0.19 m MV E velocity: 64.40 cm/s  Systemic Diam: 2.10 cm MV A velocity: 59.40 cm/s MV E/A ratio:  1.08 Epifanio Lesches MD Electronically signed by Epifanio Lesches MD Signature Date/Time:  11/02/2020/10:49:43 AM    Final     Micro Results     Today   Subjective    Shyrl Numbers today has no headache,no chest abdominal pain,no new weakness tingling or numbness, feels much better wants to go home today.     Objective   Vitals with BMI 11/02/2020 11/02/2020 11/02/2020  Height - - -  Weight - - -  BMI - - -  Systolic 172 119 937  Diastolic 81 86 69  Pulse 114 73 62      Exam  Awake Alert, No new F.N deficits, mildly anxious affect [place].AT,PERRAL Supple Neck,No JVD, No cervical lymphadenopathy appriciated.  Symmetrical Chest wall movement, Good air movement bilaterally, CTAB RRR,No Gallops,Rubs or new Murmurs, No Parasternal Heave +ve B.Sounds, Abd Soft, Non tender, No organomegaly appriciated, No rebound -guarding or rigidity. No Cyanosis, Clubbing or edema, No new Rash or bruise   Data Review   CBC w Diff:  Lab Results  Component Value Date   WBC 5.7 10/31/2020   HGB 16.3 10/31/2020   HCT 47.9 10/31/2020   PLT 196 10/31/2020   LYMPHOPCT 38 10/31/2020   MONOPCT 7 10/31/2020   EOSPCT 1 10/31/2020   BASOPCT 1 10/31/2020    CMP:  Lab Results  Component Value Date   NA 140 10/31/2020   K 3.7 10/31/2020   CL 101 10/31/2020   CO2 28 10/31/2020   BUN 15 10/31/2020   CREATININE 0.82 10/31/2020   PROT 7.0 10/31/2020   ALBUMIN 4.4 10/31/2020   BILITOT 0.5 10/31/2020   ALKPHOS 66 10/31/2020   AST 21 10/31/2020   ALT 21 10/31/2020  . Lab Results  Component Value Date   TSH 3.295 10/31/2020    MRI - Chiari 1 malformation, as described. Otherwise unremarkable non-contrast MRI appearance of the brain.   Bilateral maxillary sinus mucous retention cysts.  TTE -   1. Left ventricular ejection fraction, by estimation, is 60 to 65%. The left ventricle has normal function. The left ventricle has no regional wall motion abnormalities. Left ventricular diastolic parameters were normal.   2. Right ventricular systolic function is normal. The right ventricular size  is normal. Tricuspid regurgitation signal is inadequate for assessing PA pressure.   3. The mitral valve is normal in structure. Trivial mitral valve regurgitation.   4. The aortic valve was not well visualized. Aortic valve regurgitation is not visualized. No aortic stenosis is present.   5. The inferior vena cava is dilated in size with >50% respiratory variability, suggesting right atrial pressure of 8 mmHg.    Total Time in preparing paper work, data evaluation and todays exam - 35 minutes  Susa Raring M.D on 11/02/2020 at 11:36 AM  [facility]

## 2022-01-07 ENCOUNTER — Ambulatory Visit: Payer: Managed Care, Other (non HMO) | Admitting: Internal Medicine

## 2022-02-04 ENCOUNTER — Ambulatory Visit: Payer: Managed Care, Other (non HMO) | Admitting: Internal Medicine

## 2022-03-11 ENCOUNTER — Ambulatory Visit: Payer: Managed Care, Other (non HMO) | Admitting: Internal Medicine

## 2022-04-13 NOTE — Progress Notes (Deleted)
NEW PATIENT Date of Service/Encounter:  04/13/22 Referring provider: Alan Ripper, PA Primary care provider: Alan Ripper, PA  Subjective:  Hector Thomas is a 37 y.o. male with a PMHx of *** presenting today for evaluation of chronic rhinitis. History obtained from: chart review and {Persons; PED relatives w/patient:19415::"patient"}.   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 Bilateral maxillary sinus mucous retention cysts, the largest on the right measuring 2 cm"   Other allergy screening: Asthma: {Blank single:19197::"yes","no"} Rhino conjunctivitis: {Blank single:19197::"yes","no"} Food allergy: {Blank single:19197::"yes","no"} Medication allergy: {Blank single:19197::"yes","no"} Hymenoptera allergy: {Blank single:19197::"yes","no"} Urticaria: {Blank single:19197::"yes","no"} Eczema:{Blank single:19197::"yes","no"} History of recurrent infections suggestive of immunodeficency: {Blank single:19197::"yes","no"} ***Vaccinations are up to date.   Past Medical History: Past Medical History:  Diagnosis Date   Acid reflux    Drug abuse ([place])    RUQ pain    Vitamin D deficiency    Medication List:  Current Outpatient Medications  Medication Sig Dispense Refill   Ascorbic Acid (VITAMIN C) 100 MG tablet Take 100 mg by  mouth daily.     Buprenorphine HCl-Naloxone HCl 8-2 MG FILM Place 4 Film under the tongue 2 (two) times daily.     cholecalciferol (VITAMIN D3) 25 MCG (1000 UNIT) tablet Take 1,000 Units by mouth daily.     famotidine (PEPCID) 40 MG tablet Take 40 mg by mouth daily as needed for heartburn or indigestion.     L-CITRULLINE PO Take 2 capsules by mouth in the morning.     magnesium 30 MG tablet Take 30 mg by mouth daily.     metoprolol tartrate (LOPRESSOR) 25 MG tablet Take 1 tablet (25 mg total) by mouth 2 (two) times daily as needed (BP > 140, H.Rate > 95). 30 tablet 0   testosterone cypionate (DEPOTESTOSTERONE CYPIONATE) 200 MG/ML injection Inject 100 mg into the muscle every 7 (seven) days.     zinc gluconate 50 MG tablet Take 50 mg by mouth daily.     Current Facility-Administered Medications  Medication Dose Route Frequency Provider Last Rate Last Admin   0.9 %  sodium chloride infusion  500 mL Intravenous Once Armbruster, Carlota Raspberry, MD       Known Allergies:  No Known Allergies Past Surgical History: Past Surgical History:  Procedure Laterality Date   HERNIA REPAIR     VASECTOMY  03/2016   WISDOM TOOTH EXTRACTION     Family History: Family History  Problem Relation Age of Onset   Heart disease Mother    Hypertension Mother    Alcohol abuse Father    Colon cancer Maternal Grandmother    Stomach cancer Maternal Grandmother    Ovarian cancer Maternal Grandmother    Breast cancer Maternal Grandmother    Pancreatic cancer Paternal Grandmother    Social History: [name] lives ***.   ROS:  All other systems negative except as noted per HPI.  Objective:  There were no vitals taken for this visit. There is no height or weight on file to calculate BMI. Physical Exam:  General Appearance:  Alert, cooperative, no distress, appears stated age  Head:  Normocephalic, without obvious abnormality, atraumatic  Eyes:  Conjunctiva clear, EOM's intact  Nose: Nares normal, {Blank  multiple:19196:a:"***","hypertrophic turbinates","normal mucosa","no visible anterior polyps","septum midline"}  Throat: Lips, tongue normal; teeth and gums normal, {Blank multiple:19196:a:"***","normal posterior oropharynx","tonsils 2+","tonsils 3+","no tonsillar exudate","+ cobblestoning"}  Neck: Supple, symmetrical  Lungs:   {Blank multiple:19196:a:"***","clear to auscultation bilaterally","end-expiratory wheezing","wheezing throughout"}, Respirations unlabored, {Blank multiple:19196:a:"***","no coughing","intermittent dry coughing"}  Heart:  {Blank multiple:19196:a:"***","regular rate and rhythm","no murmur"}, Appears well perfused  Extremities: No edema  Skin: Skin color, texture, turgor normal, no rashes or lesions on visualized portions of skin  Neurologic: No gross deficits     Diagnostics: Spirometry:  Tracings reviewed. His effort: {Blank single:19197::"Good reproducible efforts.","It was hard to get consistent efforts and there is a question as to whether this reflects a maximal maneuver.","Poor effort, data can not be interpreted.","Variable effort-results affected.","decent for first attempt at spirometry."} FVC: ***L (pre), ***L  (post) FEV1: ***L, ***% predicted (pre), ***L, ***% predicted (post) FEV1/FVC ratio: *** (pre), *** (post) Interpretation: {Blank single:19197::"Spirometry consistent with mild obstructive disease","Spirometry consistent with moderate obstructive disease","Spirometry consistent with severe obstructive disease","Spirometry consistent with possible restrictive disease","Spirometry consistent with mixed obstructive and restrictive disease","Spirometry uninterpretable due to technique","Spirometry consistent with normal pattern","No overt abnormalities noted given today's efforts"} with *** bronchodilator response  Skin Testing: {Blank single:19197::"Select foods","Environmental allergy panel","Environmental allergy panel and select foods","Food allergy  panel","None","Deferred due to recent antihistamines use"}. *** Adequate controls. Results discussed with patient/family.   {Blank single:19197::"Allergy testing results were read and interpreted by myself, documented by clinical staff."," "}  Assessment and Plan  ***  {Blank single:19197::"This note in its entirety was forwarded to the Provider who requested this consultation."}  Thank you for your kind referral. I appreciate the opportunity to take part in [facility] care. Please do not hesitate to contact me with questions.***  Sincerely,  Sigurd Sos, MD [facility] [place]

## 2022-04-15 ENCOUNTER — Ambulatory Visit: Payer: Managed Care, Other (non HMO) | Admitting: Internal Medicine

## 2023-06-15 IMAGING — MR MR HEAD W/O CM
6 of 10 series · 27 of 48 positions shown · non-contrast
Comparison: CT angiogram head/neck 10/31/2020.

CLINICAL DATA: Chiari 1 malformation. Additional history provided:
Headache, dizziness, near syncope, orthostatic hypertension.

EXAM:
MRI HEAD WITHOUT CONTRAST
TECHNIQUE: Multiplanar, multiecho pulse sequences of the brain and surrounding
structures were obtained without intravenous contrast.

[Series 2: DWI · axial · 3.0mm · 0.94mm/px · z∈[-129,+20]mm · 8 of 108 slices shown (1 of 2)]
[im 1/108]
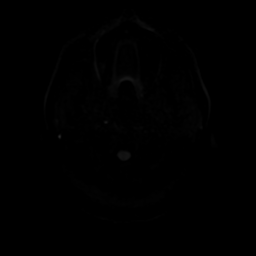
[im 12/108]
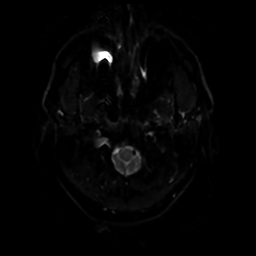
[im 36/108]
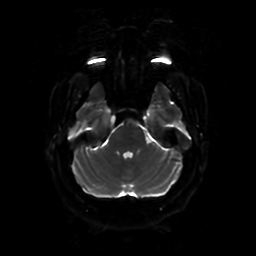
[im 48/108]
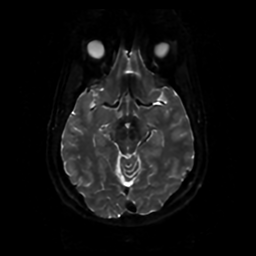
[im 60/108]
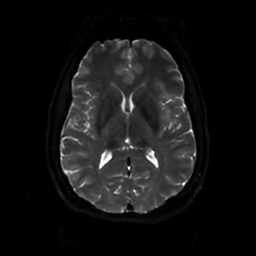
[im 72/108]
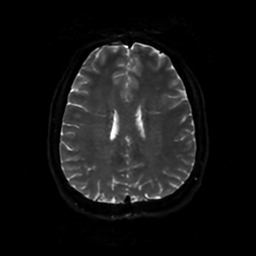
[im 96/108]
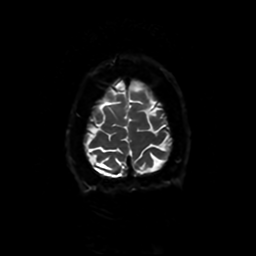
[im 108/108]
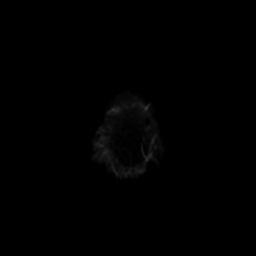

[Series 3: DWI · coronal · 4.0mm · 0.94mm/px · 6 of 74 slices shown (2 of 2)]
[im 1/74]
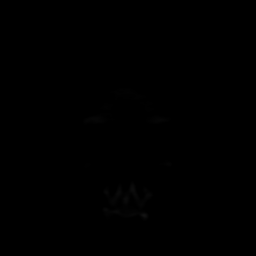
[im 15/74]
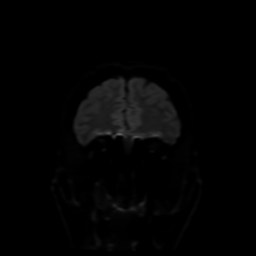
[im 30/74]
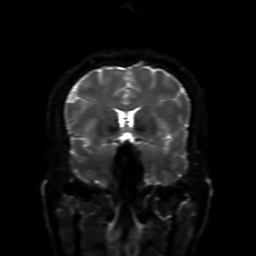
[im 44/74]
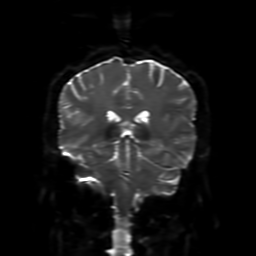
[im 59/74]
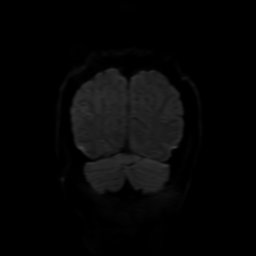
[im 74/74]
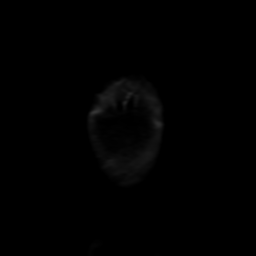

[Series 4: FLAIR · sagittal · 5.0mm · 0.23mm/px · 2 of 23 slices shown (1 of 2)]
[im 1/23]
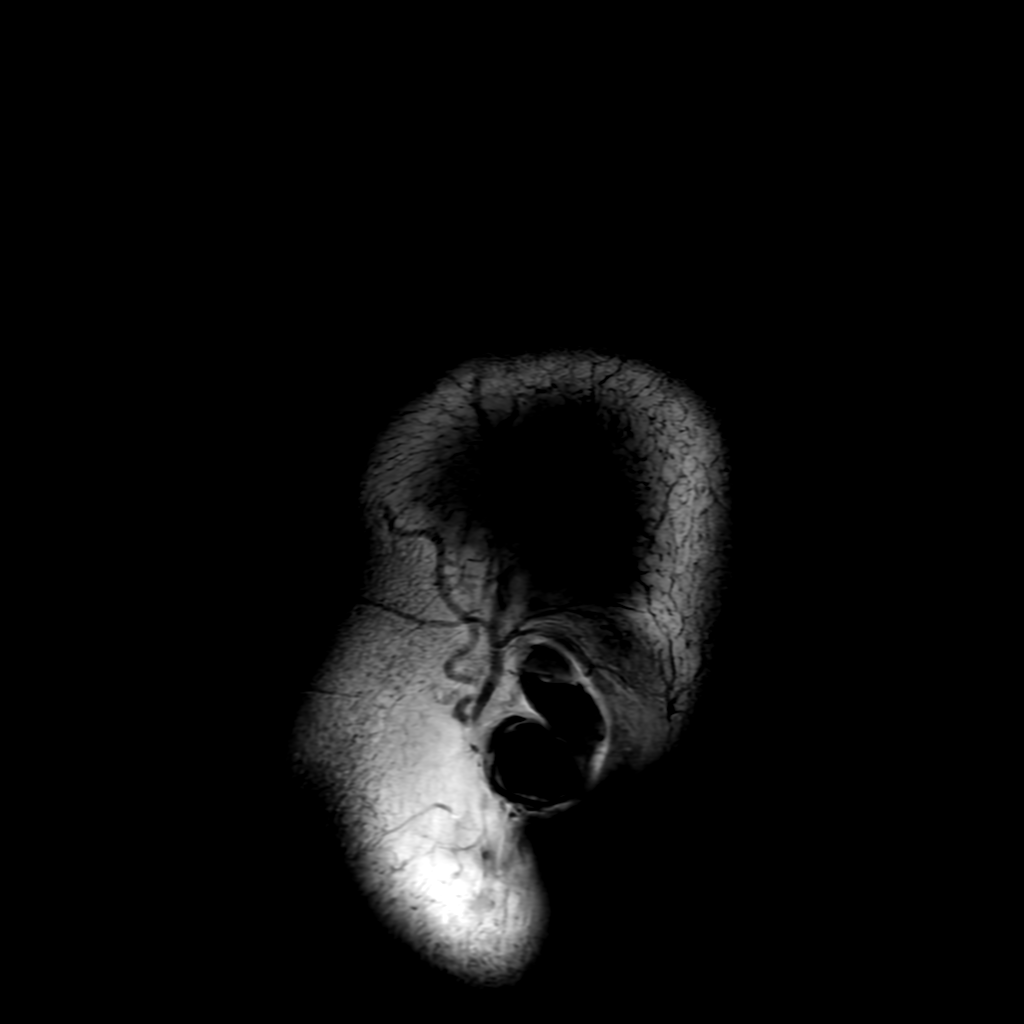
[im 23/23]
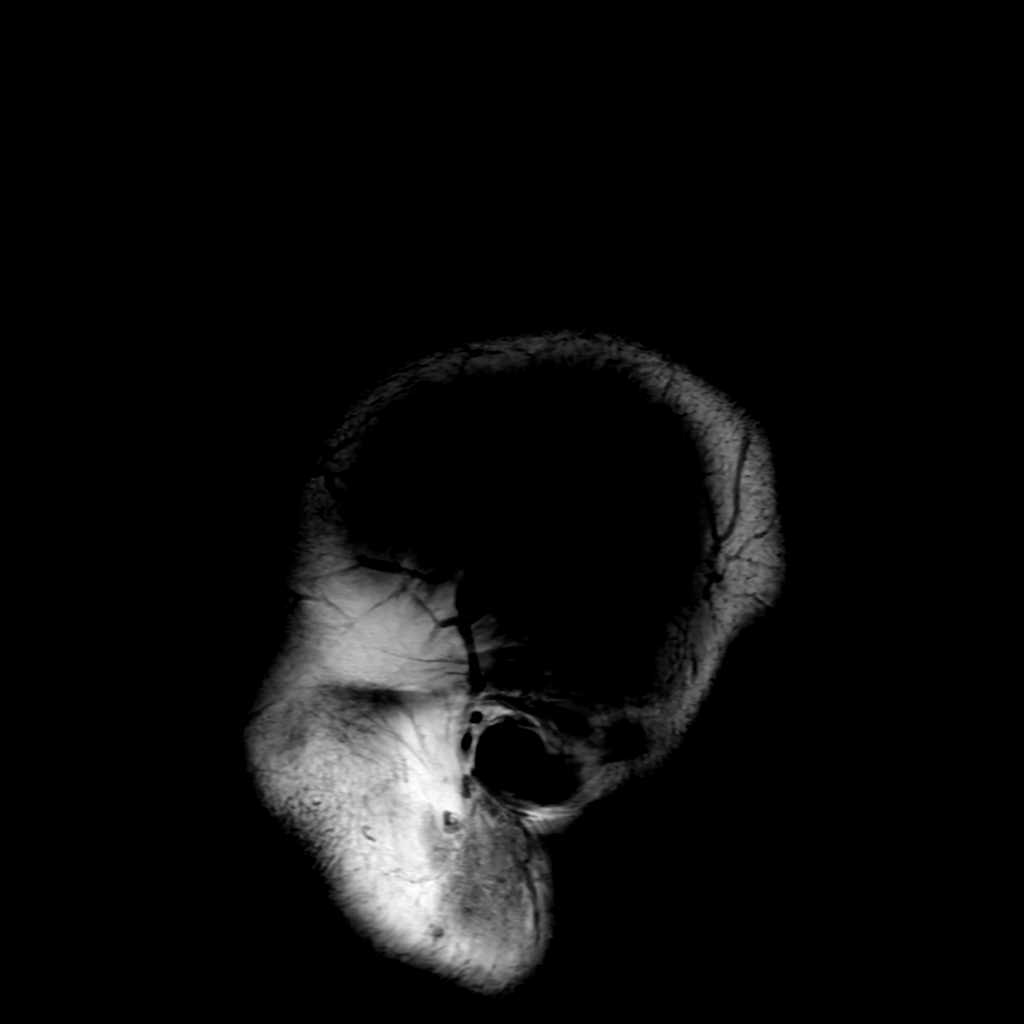

[Series 6: FLAIR · axial · 4.0mm · 0.45mm/px · z∈[-127,+22]mm · 3 of 37 slices shown (2 of 2)]
[im 1/37]
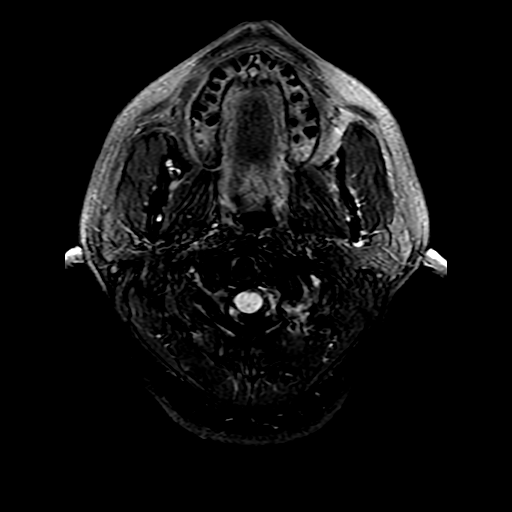
[im 19/37]
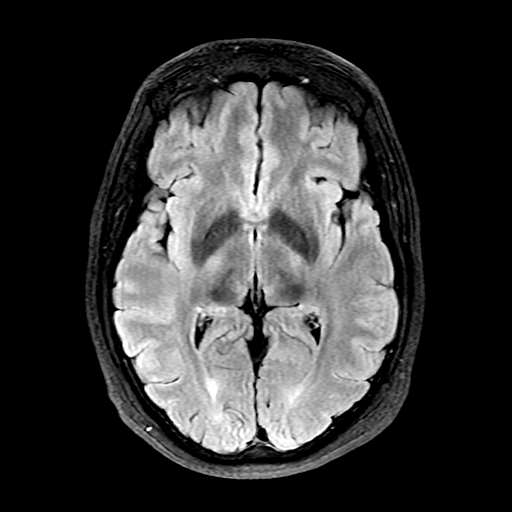
[im 37/37]
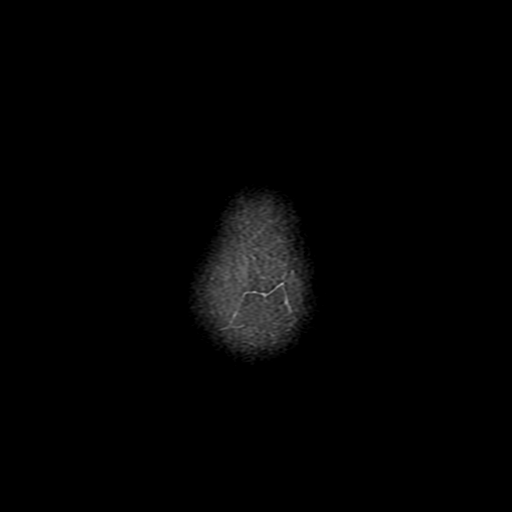

[Series 250: ADC · axial · 3.0mm · 0.94mm/px · z∈[-129,+20]mm · 5 of 54 slices shown (1 of 2)]
[im 1/54]
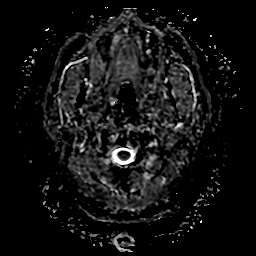
[im 14/54]
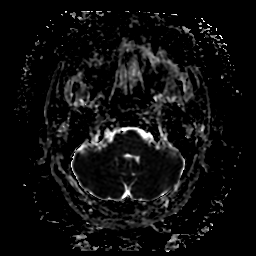
[im 27/54]
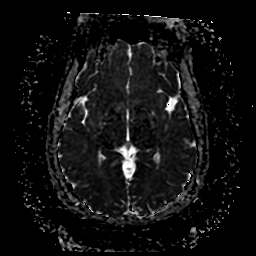
[im 40/54]
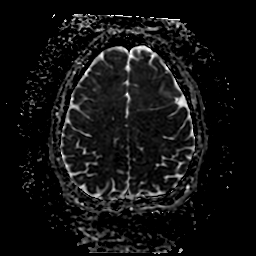
[im 54/54]
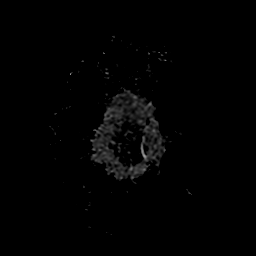

[Series 350: ADC · coronal · 4.0mm · 0.94mm/px · 3 of 36 slices shown (2 of 2)]
[im 1/36]
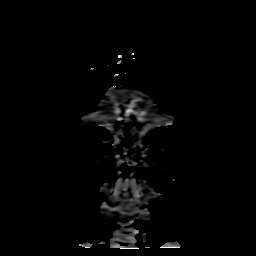
[im 18/36]
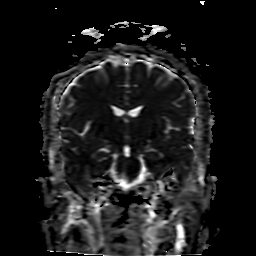
[im 36/36]
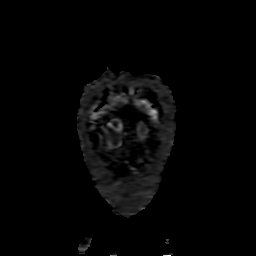

[27 of 48 positions shown; findings below may reference images not displayed]

FINDINGS: Brain:

Cerebral volume is normal.

Redemonstrated Chiari 1 malformation. The cerebellar tonsils extend
9 mm below the foramen magnum. Associated crowding at the level of
the foramen magnum.

No cortical encephalomalacia is identified. No significant cerebral
white matter disease.

There is no acute infarct.

No evidence of an intracranial mass.

No chronic intracranial blood products.

No extra-axial fluid collection.

No midline shift.

Vascular: Expected proximal arterial flow voids.

Skull and upper cervical spine: No focal marrow lesion.

Sinuses/Orbits: Visualized orbits show no acute finding. Bilateral
maxillary sinus mucous retention cysts, the largest on the right
measuring 2 cm.
IMPRESSION: Chiari 1 malformation, as described.

Otherwise unremarkable non-contrast MRI appearance of the brain.

Bilateral maxillary sinus mucous retention cysts.

## 2024-04-12 DIAGNOSIS — F1910 Other psychoactive substance abuse, uncomplicated: Secondary | ICD-10-CM | POA: Insufficient documentation

## 2024-04-12 DIAGNOSIS — R1011 Right upper quadrant pain: Secondary | ICD-10-CM | POA: Insufficient documentation

## 2024-04-12 DIAGNOSIS — E559 Vitamin D deficiency, unspecified: Secondary | ICD-10-CM | POA: Insufficient documentation

## 2024-04-16 ENCOUNTER — Ambulatory Visit
